# Patient Record
Sex: Male | Born: 1985 | Race: Black or African American | Hispanic: No | Marital: Single | Smoking: Never smoker
Health system: Southern US, Community
[De-identification: ages and names within clinical notes are randomized; demographics above are authoritative.]

## PROBLEM LIST (undated history)

## (undated) DIAGNOSIS — I639 Cerebral infarction, unspecified: Secondary | ICD-10-CM

## (undated) HISTORY — PX: ROTATOR CUFF REPAIR: SHX139

## (undated) HISTORY — DX: Cerebral infarction, unspecified: I63.9

## (undated) HISTORY — PX: HERNIA REPAIR: SHX51

---
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
## 2009-03-12 ENCOUNTER — Emergency Department (HOSPITAL_COMMUNITY): Admission: EM | Admit: 2009-03-12 | Discharge: 2009-03-12 | Payer: Self-pay | Admitting: Family Medicine

## 2010-05-26 ENCOUNTER — Emergency Department (HOSPITAL_COMMUNITY)
Admission: EM | Admit: 2010-05-26 | Discharge: 2010-05-26 | Disposition: A | Discharge status: Home / Self Care | Payer: BC Managed Care – PPO | Attending: Emergency Medicine | Admitting: Emergency Medicine

## 2010-05-26 DIAGNOSIS — K047 Periapical abscess without sinus: Secondary | ICD-10-CM | POA: Insufficient documentation

## 2010-05-26 DIAGNOSIS — R220 Localized swelling, mass and lump, head: Secondary | ICD-10-CM | POA: Insufficient documentation

## 2010-05-26 DIAGNOSIS — R221 Localized swelling, mass and lump, neck: Secondary | ICD-10-CM | POA: Insufficient documentation

## 2010-05-26 DIAGNOSIS — K089 Disorder of teeth and supporting structures, unspecified: Secondary | ICD-10-CM | POA: Insufficient documentation

## 2010-08-26 NOTE — Op Note (Signed)
NAME:  Marcus Hughes, Marcus Hughes                         ACCOUNT NO.:  [id number]   MEDICAL RECORD NO.:  [id number]                   PATIENT TYPE:  AMB   LOCATION:  DAY                                  FACILITY:  [facility]   PHYSICIAN:  Adolph Pollack, M.D.            DATE OF BIRTH:  04/03/1986   DATE OF PROCEDURE:  10/16/2003  DATE OF DISCHARGE:                                 OPERATIVE REPORT   PREOPERATIVE DIAGNOSIS:  Right inguinal hernia.   POSTOPERATIVE DIAGNOSIS:  Indirect right inguinal hernia.   PROCEDURE:  Repair of indirect right inguinal hernia with mesh.   SURGEON:  Adolph Pollack, M.D.   ANESTHESIA:  General.   INDICATIONS:  An 25 year old male who has noticed a right inguinal bulge for  a long time and then saw Dr. Lance Bosch and was diagnosed with a right inguinal  hernia.  He now presents for right inguinal hernia with mesh.  The procedure  and risks were explained during the preoperative period.   TECHNIQUE:  He is seen in the holding area and the right groin marked with  my initials.  He was brought to the operating room and placed supine on the  operating room table.  General anesthetic was administered.  The right groin  area was sterilely prepped and draped.  Local anesthetic was infiltrated in  the superficial and deep tissues and a right groin incision was made through  the skin, subcutaneous tissue, and Scarpa fascia until the external oblique  aponeurosis was identified.  Local anesthetic was identified deep to the  external oblique aponeurosis and an incision was made in it.  It was split  in the direction of its fibers to the external ring medially and up towards  the anterior superior iliac spine laterally.  Using blunt dissection, the  shelving edge of the inguinal ligament was exposed inferiorly, and the  internal oblique muscle and aponeurosis were exposed superiorly.  Both the  iliohypogastric and ilioinguinal nerves were exposed and retracted free from  the area.   The spermatic cord was then isolated with blunt dissection, and the indirect  sac was identified, which was fairly adherent to the cord structures.  Using  careful blunt dissection as well as selective sharp dissection and cautery,  I was able to dissect the sac free from the cord structures and reduce it  back into the extraperitoneal space through a patulous internal ring.  Placed the patient in the lower Trendelenburg position helped to keep the  sac in the extraperitoneal space.   Next, a 3 x 6 inch piece of polypropylene mesh was brought into the field  and anchored 1 cm medial to the pubic tubercle with a 2-0 Prolene suture.  The inferior aspect of the mesh was then anchored to the shelving edge of  the inguinal ligament with a running 2-0 Prolene up to the level 1 cm  lateral to the internal ring.  A slit was cut in the mesh and wrapped around  the cord.  The superior aspect of the mesh was then anchored to the internal  oblique muscle and aponeurosis with interrupted 2-0 Vicryl sutures.  The two  tails of the mesh were then wrapped around the cord, creating a new internal  ring.  These were then anchored to the shelving edge of the inguinal  ligament with a single 2-0 Prolene suture.  The tip of the hemostat was able  to be placed in the aperture, and the cord was mobile.   The lateral aspects of the mesh were then tucked deep to the external  oblique aponeurosis laterally.  Hemostasis was adequate at this time.  The  external oblique aponeurosis was closed with a mesh with a running 3-0  Vicryl suture.  Scarpa's fascia was closed with a running 2-0 Vicryl suture.  The skin was closed with a running 4-0 Monocryl subcuticular stitch.  Steri-  Strips and sterile dressings were applied.  The right testicle was in its  normal position in the scrotum.   He tolerated the procedure well without any apparent complications.  He was  taken to the recovery room in satisfactory  condition.                                               Adolph Pollack, M.D.    Kari Baars  D:  10/16/2003  T:  10/16/2003  Job:  [id number]   cc:   Reinaldo Raddle. Lance Bosch, M.D.  [facility]  [address]  [place]  [place] [address]  Fax: [phone]

## 2011-01-22 ENCOUNTER — Inpatient Hospital Stay (INDEPENDENT_AMBULATORY_CARE_PROVIDER_SITE_OTHER)
Admission: RE | Admit: 2011-01-22 | Discharge: 2011-01-22 | Disposition: A | Discharge status: Home / Self Care | Source: Ambulatory Visit | Attending: Family Medicine | Admitting: Family Medicine

## 2011-01-22 DIAGNOSIS — L259 Unspecified contact dermatitis, unspecified cause: Secondary | ICD-10-CM

## 2011-01-23 LAB — GC/CHLAMYDIA PROBE AMP, URINE: Chlamydia, Swab/Urine, PCR: NEGATIVE

## 2013-02-23 ENCOUNTER — Emergency Department (HOSPITAL_COMMUNITY): Payer: BC Managed Care – PPO

## 2013-02-23 ENCOUNTER — Encounter (HOSPITAL_COMMUNITY)
Admission: EM | Disposition: A | Discharge status: Home / Self Care | Payer: Self-pay | Source: Home / Self Care | Attending: Neurology

## 2013-02-23 ENCOUNTER — Inpatient Hospital Stay (HOSPITAL_COMMUNITY): Payer: BC Managed Care – PPO

## 2013-02-23 ENCOUNTER — Inpatient Hospital Stay (HOSPITAL_COMMUNITY)
Admission: EM | Admit: 2013-02-23 | Discharge: 2013-02-26 | DRG: 023 | Disposition: A | Discharge status: Home / Self Care | Payer: BC Managed Care – PPO | Attending: Neurology | Admitting: Neurology

## 2013-02-23 ENCOUNTER — Inpatient Hospital Stay (HOSPITAL_COMMUNITY): Payer: BC Managed Care – PPO | Admitting: Anesthesiology

## 2013-02-23 ENCOUNTER — Encounter (HOSPITAL_COMMUNITY): Payer: Self-pay | Admitting: Emergency Medicine

## 2013-02-23 ENCOUNTER — Encounter (HOSPITAL_COMMUNITY): Payer: BC Managed Care – PPO | Admitting: Anesthesiology

## 2013-02-23 DIAGNOSIS — H53149 Visual discomfort, unspecified: Secondary | ICD-10-CM

## 2013-02-23 DIAGNOSIS — F411 Generalized anxiety disorder: Secondary | ICD-10-CM | POA: Diagnosis not present

## 2013-02-23 DIAGNOSIS — I6350 Cerebral infarction due to unspecified occlusion or stenosis of unspecified cerebral artery: Secondary | ICD-10-CM

## 2013-02-23 DIAGNOSIS — R519 Headache, unspecified: Secondary | ICD-10-CM | POA: Diagnosis present

## 2013-02-23 DIAGNOSIS — R29810 Facial weakness: Secondary | ICD-10-CM | POA: Diagnosis present

## 2013-02-23 DIAGNOSIS — H53469 Homonymous bilateral field defects, unspecified side: Secondary | ICD-10-CM | POA: Diagnosis present

## 2013-02-23 DIAGNOSIS — F1210 Cannabis abuse, uncomplicated: Secondary | ICD-10-CM | POA: Diagnosis present

## 2013-02-23 DIAGNOSIS — R61 Generalized hyperhidrosis: Secondary | ICD-10-CM | POA: Diagnosis present

## 2013-02-23 DIAGNOSIS — G936 Cerebral edema: Secondary | ICD-10-CM | POA: Diagnosis present

## 2013-02-23 DIAGNOSIS — I639 Cerebral infarction, unspecified: Secondary | ICD-10-CM | POA: Diagnosis present

## 2013-02-23 DIAGNOSIS — E876 Hypokalemia: Secondary | ICD-10-CM | POA: Diagnosis present

## 2013-02-23 DIAGNOSIS — E785 Hyperlipidemia, unspecified: Secondary | ICD-10-CM

## 2013-02-23 DIAGNOSIS — I6340 Cerebral infarction due to embolism of unspecified cerebral artery: Principal | ICD-10-CM | POA: Diagnosis present

## 2013-02-23 DIAGNOSIS — R4789 Other speech disturbances: Secondary | ICD-10-CM | POA: Diagnosis present

## 2013-02-23 DIAGNOSIS — I63511 Cerebral infarction due to unspecified occlusion or stenosis of right middle cerebral artery: Secondary | ICD-10-CM

## 2013-02-23 DIAGNOSIS — R51 Headache: Secondary | ICD-10-CM | POA: Diagnosis present

## 2013-02-23 DIAGNOSIS — Z79899 Other long term (current) drug therapy: Secondary | ICD-10-CM

## 2013-02-23 DIAGNOSIS — R1310 Dysphagia, unspecified: Secondary | ICD-10-CM | POA: Diagnosis present

## 2013-02-23 DIAGNOSIS — G8190 Hemiplegia, unspecified affecting unspecified side: Secondary | ICD-10-CM | POA: Diagnosis present

## 2013-02-23 DIAGNOSIS — J9600 Acute respiratory failure, unspecified whether with hypoxia or hypercapnia: Secondary | ICD-10-CM | POA: Diagnosis not present

## 2013-02-23 HISTORY — PX: RADIOLOGY WITH ANESTHESIA: SHX6223

## 2013-02-23 LAB — BLOOD GAS, ARTERIAL
Acid-base deficit: 5.2 mmol/L — ABNORMAL HIGH (ref 0.0–2.0)
Bicarbonate: 18.8 mEq/L — ABNORMAL LOW (ref 20.0–24.0)
FIO2: 0.4 %
O2 Saturation: 99 %
PEEP: 5 cmH2O
Patient temperature: 94.3
RATE: 18 resp/min

## 2013-02-23 LAB — PROTIME-INR
INR: 0.98 (ref 0.00–1.49)
Prothrombin Time: 12.8 seconds (ref 11.6–15.2)

## 2013-02-23 LAB — RAPID URINE DRUG SCREEN, HOSP PERFORMED
Amphetamines: NOT DETECTED
Tetrahydrocannabinol: POSITIVE — AB

## 2013-02-23 LAB — COMPREHENSIVE METABOLIC PANEL
AST: 47 U/L — ABNORMAL HIGH (ref 0–37)
BUN: 17 mg/dL (ref 6–23)
CO2: 24 mEq/L (ref 19–32)
Chloride: 101 mEq/L (ref 96–112)
Creatinine, Ser: 1.29 mg/dL (ref 0.50–1.35)
GFR calc non Af Amer: 75 mL/min — ABNORMAL LOW (ref >90)
Total Bilirubin: 0.6 mg/dL (ref 0.3–1.2)

## 2013-02-23 LAB — URINALYSIS, ROUTINE W REFLEX MICROSCOPIC
Glucose, UA: NEGATIVE mg/dL
Ketones, ur: NEGATIVE mg/dL
Leukocytes, UA: NEGATIVE
Protein, ur: NEGATIVE mg/dL

## 2013-02-23 LAB — ACETAMINOPHEN LEVEL: Acetaminophen (Tylenol), Serum: <15 ug/mL (ref 10–30)

## 2013-02-23 LAB — CBC WITH DIFFERENTIAL/PLATELET
HCT: 39.9 % (ref 39.0–52.0)
Hemoglobin: 14.3 g/dL (ref 13.0–17.0)
Lymphocytes Relative: 42 % (ref 12–46)
Lymphs Abs: 2.1 10*3/uL (ref 0.7–4.0)
Monocytes Absolute: 0.4 10*3/uL (ref 0.1–1.0)
Monocytes Relative: 8 % (ref 3–12)
Neutro Abs: 2.4 10*3/uL (ref 1.7–7.7)
WBC: 5 10*3/uL (ref 4.0–10.5)

## 2013-02-23 LAB — ETHANOL: Alcohol, Ethyl (B): <11 mg/dL (ref 0–11)

## 2013-02-23 SURGERY — RADIOLOGY WITH ANESTHESIA
Anesthesia: General

## 2013-02-23 MED ORDER — ALTEPLASE 30 MG/30 ML FOR INTERV. RAD
1.0000 mg | INTRA_ARTERIAL | Status: AC
  Administered 2013-02-23: 9 mg via INTRA_ARTERIAL
  Filled 2013-02-23: qty 30

## 2013-02-23 MED ORDER — FENTANYL CITRATE 0.05 MG/ML IJ SOLN
INTRAMUSCULAR | Status: AC | PRN
  Administered 2013-02-23: 25 ug via INTRAVENOUS

## 2013-02-23 MED ORDER — NICARDIPINE HCL IN NACL 20-0.86 MG/200ML-% IV SOLN: 5.0000 mg/h | INTRAVENOUS | Status: DC

## 2013-02-23 MED ORDER — SODIUM CHLORIDE 0.9 % IV SOLN
INTRAVENOUS | Status: DC
  Administered 2013-02-24: 21:00:00 via INTRAVENOUS
  Administered 2013-02-24: 1000 mL via INTRAVENOUS

## 2013-02-23 MED ORDER — FENTANYL CITRATE 0.05 MG/ML IJ SOLN
25.0000 ug | INTRAMUSCULAR | Status: DC | PRN
  Administered 2013-02-24 (×2): 100 ug via INTRAVENOUS
  Filled 2013-02-23 (×2): qty 2

## 2013-02-23 MED ORDER — PANTOPRAZOLE SODIUM 40 MG IV SOLR
40.0000 mg | Freq: Every day | INTRAVENOUS | Status: DC
  Administered 2013-02-24 (×2): 40 mg via INTRAVENOUS
  Filled 2013-02-23 (×3): qty 40

## 2013-02-23 MED ORDER — MIDAZOLAM HCL 2 MG/2ML IJ SOLN
INTRAMUSCULAR | Status: AC
  Filled 2013-02-23: qty 2

## 2013-02-23 MED ORDER — ONDANSETRON HCL 4 MG/2ML IJ SOLN
4.0000 mg | Freq: Four times a day (QID) | INTRAMUSCULAR | Status: DC | PRN
  Administered 2013-02-24: 4 mg via INTRAVENOUS
  Filled 2013-02-23: qty 2

## 2013-02-23 MED ORDER — VECURONIUM BROMIDE 10 MG IV SOLR
INTRAVENOUS | Status: DC | PRN
  Administered 2013-02-23: 3 mg via INTRAVENOUS
  Administered 2013-02-23: 2 mg via INTRAVENOUS
  Administered 2013-02-23: 5 mg via INTRAVENOUS

## 2013-02-23 MED ORDER — LABETALOL HCL 5 MG/ML IV SOLN: 10.0000 mg | INTRAVENOUS | Status: DC | PRN

## 2013-02-23 MED ORDER — SODIUM CHLORIDE 0.9 % IV BOLUS (SEPSIS)
1000.0000 mL | Freq: Once | INTRAVENOUS | Status: AC
  Administered 2013-02-23: 1000 mL via INTRAVENOUS

## 2013-02-23 MED ORDER — ARTIFICIAL TEARS OP OINT
TOPICAL_OINTMENT | OPHTHALMIC | Status: DC | PRN
  Administered 2013-02-23: 1 via OPHTHALMIC

## 2013-02-23 MED ORDER — MIDAZOLAM HCL 2 MG/2ML IJ SOLN
INTRAMUSCULAR | Status: AC | PRN
  Administered 2013-02-23: 1 mg via INTRAVENOUS

## 2013-02-23 MED ORDER — ACETAMINOPHEN 650 MG RE SUPP: 650.0000 mg | Freq: Four times a day (QID) | RECTAL | Status: DC | PRN

## 2013-02-23 MED ORDER — PHENYLEPHRINE HCL 10 MG/ML IJ SOLN
INTRAMUSCULAR | Status: DC | PRN
  Administered 2013-02-23: 40 ug via INTRAVENOUS

## 2013-02-23 MED ORDER — FENTANYL CITRATE 0.05 MG/ML IJ SOLN
INTRAMUSCULAR | Status: DC | PRN
  Administered 2013-02-23 (×5): 50 ug via INTRAVENOUS

## 2013-02-23 MED ORDER — SUCCINYLCHOLINE CHLORIDE 20 MG/ML IJ SOLN
INTRAMUSCULAR | Status: DC | PRN
  Administered 2013-02-23: 120 mg via INTRAVENOUS

## 2013-02-23 MED ORDER — HYDROMORPHONE HCL PF 1 MG/ML IJ SOLN
1.0000 mg | Freq: Once | INTRAMUSCULAR | Status: AC
  Administered 2013-02-23: 1 mg via INTRAVENOUS
  Filled 2013-02-23: qty 1

## 2013-02-23 MED ORDER — PROPOFOL INFUSION 10 MG/ML OPTIME
INTRAVENOUS | Status: DC | PRN
  Administered 2013-02-23: 100 ug/kg/min via INTRAVENOUS
  Administered 2013-02-23: 22:00:00 via INTRAVENOUS

## 2013-02-23 MED ORDER — ACETAMINOPHEN 500 MG PO TABS
1000.0000 mg | ORAL_TABLET | Freq: Four times a day (QID) | ORAL | Status: DC | PRN
  Administered 2013-02-24: 1000 mg via ORAL
  Filled 2013-02-23: qty 2

## 2013-02-23 MED ORDER — SODIUM CHLORIDE 0.9 % IV SOLN
INTRAVENOUS | Status: DC | PRN
  Administered 2013-02-23 (×2): via INTRAVENOUS

## 2013-02-23 MED ORDER — ALTEPLASE (STROKE) FULL DOSE INFUSION
0.9000 mg/kg | Freq: Once | INTRAVENOUS | Status: AC
  Administered 2013-02-23: 70 mg via INTRAVENOUS
  Filled 2013-02-23: qty 78

## 2013-02-23 MED ORDER — ACETAMINOPHEN 325 MG PO TABS: 650.0000 mg | ORAL_TABLET | ORAL | Status: DC | PRN

## 2013-02-23 MED ORDER — NITROGLYCERIN 1 MG/10 ML FOR IR/CATH LAB
INTRA_ARTERIAL | Status: AC
  Filled 2013-02-23: qty 10

## 2013-02-23 MED ORDER — LIDOCAINE HCL (CARDIAC) 20 MG/ML IV SOLN
INTRAVENOUS | Status: DC | PRN
  Administered 2013-02-23: 100 mg via INTRAVENOUS

## 2013-02-23 MED ORDER — IOHEXOL 300 MG/ML  SOLN
150.0000 mL | Freq: Once | INTRAMUSCULAR | Status: AC | PRN
  Administered 2013-02-23: 150 mL via INTRA_ARTERIAL

## 2013-02-23 MED ORDER — ACETAMINOPHEN 650 MG RE SUPP: 650.0000 mg | RECTAL | Status: DC | PRN

## 2013-02-23 MED ORDER — SODIUM CHLORIDE 0.9 % IV SOLN
INTRAVENOUS | Status: DC
  Administered 2013-02-24: 1000 mL via INTRAVENOUS

## 2013-02-23 MED ORDER — FENTANYL CITRATE 0.05 MG/ML IJ SOLN
INTRAMUSCULAR | Status: AC
  Filled 2013-02-23: qty 2

## 2013-02-23 MED ORDER — PROPOFOL 10 MG/ML IV BOLUS
INTRAVENOUS | Status: DC | PRN
  Administered 2013-02-23: 300 mg via INTRAVENOUS
  Administered 2013-02-23: 20 mg via INTRAVENOUS

## 2013-02-23 MED ORDER — SODIUM CHLORIDE 0.9 % IJ SOLN
INTRAMUSCULAR | Status: AC
  Filled 2013-02-23: qty 300

## 2013-02-23 MED ORDER — SODIUM CHLORIDE 0.9 % IV SOLN: 250.0000 mL | Freq: Once | INTRAVENOUS | Status: DC

## 2013-02-23 MED ORDER — PROPOFOL 10 MG/ML IV EMUL
0.0000 ug/kg/min | INTRAVENOUS | Status: DC
  Administered 2013-02-23 – 2013-02-24 (×2): 50 ug/kg/min via INTRAVENOUS
  Administered 2013-02-24: 85 ug/kg/min via INTRAVENOUS
  Administered 2013-02-24: 50 ug/kg/min via INTRAVENOUS
  Administered 2013-02-24: 100 ug/kg/min via INTRAVENOUS
  Filled 2013-02-23 (×6): qty 100

## 2013-02-23 MED ORDER — IOHEXOL 350 MG/ML SOLN
100.0000 mL | Freq: Once | INTRAVENOUS | Status: AC | PRN
  Administered 2013-02-23: 100 mL via INTRAVENOUS

## 2013-02-23 MED ORDER — PHENYLEPHRINE HCL 10 MG/ML IJ SOLN
10.0000 mg | INTRAVENOUS | Status: DC | PRN
  Administered 2013-02-23: 10 ug/min via INTRAVENOUS

## 2013-02-23 MED ORDER — ALTEPLASE 100 MG IV SOLR
INTRAVENOUS | Status: AC
  Administered 2013-02-23: 70 mg via INTRAVENOUS
  Filled 2013-02-23: qty 1

## 2013-02-23 NOTE — Anesthesia Procedure Notes (Addendum)
Procedure Name: Intubation Date/Time: 02/23/2013 8:11 PM Performed by: Luster Landsberg Pre-anesthesia Checklist: Patient identified, Emergency Drugs available, Suction available and Patient being monitored Patient Re-evaluated:Patient Re-evaluated prior to inductionOxygen Delivery Method: Circle system utilized Preoxygenation: Pre-oxygenation with 100% oxygen Intubation Type: IV induction, Rapid sequence and Cricoid Pressure applied Laryngoscope Size: Mac and 3 Grade View: Grade I Tube type: Subglottic suction tube Tube size: 7.5 mm Number of attempts: 1 Airway Equipment and Method: Stylet Placement Confirmation: ETT inserted through vocal cords under direct vision,  positive ETCO2 and breath sounds checked- equal and bilateral Secured at: 24 cm Tube secured with: Tape Dental Injury: Teeth and Oropharynx as per pre-operative assessment

## 2013-02-23 NOTE — H&P (Signed)
Admission H&P    Chief Complaint: New-onset slurred speech and right facial droop with hemiparesis.  HPI: Marcus Hughes is an 27 y.o. male with no known previous medical history who experienced sudden onset of weakness involving his left arm and left leg about 3:00 today. Patient fell to the ground and describes the fall as tripping over his leg. He noticed difficulty with use of his left extremities prior to falling. He hit his head when he fell but doesn't think he lost consciousness. CT scan of his head showed no acute intracranial abnormality. There was and equivocal increase in density of his right MCA, however. He was deemed a candidate for TPA which was administered prior to obtaining a CT angiogram of the head and neck, and subsequent transfer to [facility] for further management. 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 NIH stroke score on arrival here was 12. Formal catheter angiography was planned to be done on urgent basis to rule out possible dissection.  LSN: [id number] on 02/23/2013 tPA Given: Yes MRankin: 2  History reviewed. No pertinent past medical history.  History reviewed. No pertinent past surgical history.  No family history on file. Social History:  reports that he has never smoked. He does not have any smokeless tobacco history on file. He reports that he uses illicit drugs (Cocaine and Marijuana). He reports that he does not drink alcohol.  Allergies: No Known Allergies   (Not in a hospital admission)  ROS: History obtained from the patient  General ROS: negative for - chills, fatigue, fever, night sweats, weight gain or weight loss Psychological ROS: negative for - behavioral disorder, hallucinations, memory difficulties, mood swings or suicidal ideation Ophthalmic ROS: negative for - blurry vision, double vision, eye pain or loss of vision ENT ROS: negative for - epistaxis, nasal discharge, oral lesions, sore throat, tinnitus or vertigo Allergy  and Immunology ROS: negative for - hives or itchy/watery eyes Hematological and Lymphatic ROS: negative for - bleeding problems, bruising or swollen lymph nodes Endocrine ROS: negative for - galactorrhea, hair pattern changes, polydipsia/polyuria or temperature intolerance Respiratory ROS: negative for - cough, hemoptysis, shortness of breath or wheezing Cardiovascular ROS: negative for - chest pain, dyspnea on exertion, edema or irregular heartbeat Gastrointestinal ROS: negative for - abdominal pain, diarrhea, hematemesis, nausea/vomiting or stool incontinence Genito-Urinary ROS: negative for - dysuria, hematuria, incontinence or urinary frequency/urgency Musculoskeletal ROS: negative for - joint swelling or muscular weakness Neurological ROS: as noted in HPI Dermatological ROS: negative for rash and skin lesion changes  Physical Examination: Blood pressure 133/74, pulse 63, resp. rate 14, height 5\' 11"  (1.803 m), weight 86.183 kg (190 lb), SpO2 99.00%.  HEENT-  Normocephalic, no lesions, without obvious abnormality.  Normal external eye and conjunctiva.  Normal TM's bilaterally.  Normal auditory canals and external ears. Normal external nose, mucus membranes and septum.  Normal pharynx. Neck supple with no masses, nodes, nodules or enlargement. Cardiovascular - regular rate and rhythm, S1, S2 normal, no murmur, click, rub or gallop Lungs - chest clear, no wheezing, rales, normal symmetric air entry, Heart exam - S1, S2 normal, no murmur, no gallop, rate regular Abdomen - soft, non-tender; bowel sounds normal; no masses,  no organomegaly Extremities - no joint deformities, effusion, or inflammation, no edema and no skin discoloration  Neurologic Examination: Mental Status: Alert, oriented, thought content appropriate.  Speech fluent without evidence of aphasia. Able to follow commands without difficulty. Cranial Nerves: II-incomplete left homonymous hemianopsia. III/IV/VI-Pupils were  equal  and reacted. Extraocular movements were full and conjugate.    V/VII-mild left facial numbness and facial weakness. VIII-normal. X-normal speech and symmetrical palatal movement. Motor: Severe weakness of left upper extremity proximally and distally; moderate weakness of left lower extremity proximally and distally; normal strength of right extremities; flaccid muscle tone throughout. Sensory: Dense sensory loss to tactile stimulation over left arm and left leg. Deep Tendon Reflexes: 2+ and symmetric. Plantars: Mute on the left and flexor on the right. Cerebellar: Normal finger-to-nose testing on the right; unable to perform with left upper extremity.  Results for orders placed during the hospital encounter of 02/23/13 (from the past 48 hour(s))  CBC WITH DIFFERENTIAL     Status: None   Collection Time    02/23/13  4:02 PM      Result Value Range   WBC 5.0  4.0 - 10.5 K/uL   RBC 4.59  4.22 - 5.81 MIL/uL   Hemoglobin 14.3  13.0 - 17.0 g/dL   HCT 16.1  09.6 - 04.5 %   MCV 86.9  78.0 - 100.0 fL   MCH 31.2  26.0 - 34.0 pg   MCHC 35.8  30.0 - 36.0 g/dL   RDW 40.9  81.1 - 91.4 %   Platelets 212  150 - 400 K/uL   Neutrophils Relative % 47  43 - 77 %   Neutro Abs 2.4  1.7 - 7.7 K/uL   Lymphocytes Relative 42  12 - 46 %   Lymphs Abs 2.1  0.7 - 4.0 K/uL   Monocytes Relative 8  3 - 12 %   Monocytes Absolute 0.4  0.1 - 1.0 K/uL   Eosinophils Relative 3  0 - 5 %   Eosinophils Absolute 0.2  0.0 - 0.7 K/uL   Basophils Relative 0  0 - 1 %   Basophils Absolute 0.0  0.0 - 0.1 K/uL  COMPREHENSIVE METABOLIC PANEL     Status: Abnormal   Collection Time    02/23/13  4:02 PM      Result Value Range   Sodium 137  135 - 145 mEq/L   Potassium 3.2 (*) 3.5 - 5.1 mEq/L   Chloride 101  96 - 112 mEq/L   CO2 24  19 - 32 mEq/L   Glucose, Bld 125 (*) 70 - 99 mg/dL   BUN 17  6 - 23 mg/dL   Creatinine, Ser 7.82  0.50 - 1.35 mg/dL   Calcium 9.7  8.4 - 95.6 mg/dL   Total Protein 7.8  6.0 - 8.3 g/dL    Albumin 4.6  3.5 - 5.2 g/dL   AST 47 (*) 0 - 37 U/L   ALT 31  0 - 53 U/L   Alkaline Phosphatase 41  39 - 117 U/L   Total Bilirubin 0.6  0.3 - 1.2 mg/dL   GFR calc non Af Amer 75 (*) >90 mL/min   GFR calc Af Amer 87 (*) >90 mL/min   Comment: (NOTE)     The eGFR has been calculated using the CKD EPI equation.     This calculation has not been validated in all clinical situations.     eGFR's persistently <90 mL/min signify possible Chronic Kidney     Disease.  TROPONIN I     Status: None   Collection Time    02/23/13  4:02 PM      Result Value Range   Troponin I <0.30  <0.30 ng/mL   Comment:  Due to the release kinetics of cTnI,     a negative result within the first hours     of the onset of symptoms does not rule out     myocardial infarction with certainty.     If myocardial infarction is still suspected,     repeat the test at appropriate intervals.  ETHANOL     Status: None   Collection Time    02/23/13  4:02 PM      Result Value Range   Alcohol, Ethyl (B) <11  0 - 11 mg/dL   Comment:            LOWEST DETECTABLE LIMIT FOR     SERUM ALCOHOL IS 11 mg/dL     FOR MEDICAL PURPOSES ONLY  ACETAMINOPHEN LEVEL     Status: None   Collection Time    02/23/13  4:02 PM      Result Value Range   Acetaminophen (Tylenol), Serum <15.0  10 - 30 ug/mL   Comment:            THERAPEUTIC CONCENTRATIONS VARY     SIGNIFICANTLY. A RANGE OF 10-30     ug/mL MAY BE AN EFFECTIVE     CONCENTRATION FOR MANY PATIENTS.     HOWEVER, SOME ARE BEST TREATED     AT CONCENTRATIONS OUTSIDE THIS     RANGE.     ACETAMINOPHEN CONCENTRATIONS     >150 ug/mL AT 4 HOURS AFTER     INGESTION AND >50 ug/mL AT 12     HOURS AFTER INGESTION ARE     OFTEN ASSOCIATED WITH TOXIC     REACTIONS.  SALICYLATE LEVEL     Status: Abnormal   Collection Time    02/23/13  4:02 PM      Result Value Range   Salicylate Lvl <2.0 (*) 2.8 - 20.0 mg/dL  LACTIC ACID, PLASMA     Status: None   Collection Time     02/23/13  4:04 PM      Result Value Range   Lactic Acid, Venous 1.5  0.5 - 2.2 mmol/L  PROTIME-INR     Status: None   Collection Time    02/23/13  4:20 PM      Result Value Range   Prothrombin Time 12.8  11.6 - 15.2 seconds   INR 0.98  0.00 - 1.49  APTT     Status: None   Collection Time    02/23/13  4:20 PM      Result Value Range   aPTT 30  24 - 37 seconds  URINALYSIS, ROUTINE W REFLEX MICROSCOPIC     Status: Abnormal   Collection Time    02/23/13  5:03 PM      Result Value Range   Color, Urine YELLOW  YELLOW   APPearance HAZY (*) CLEAR   Specific Gravity, Urine 1.025  1.005 - 1.030   pH 6.0  5.0 - 8.0   Glucose, UA NEGATIVE  NEGATIVE mg/dL   Hgb urine dipstick NEGATIVE  NEGATIVE   Bilirubin Urine NEGATIVE  NEGATIVE   Ketones, ur NEGATIVE  NEGATIVE mg/dL   Protein, ur NEGATIVE  NEGATIVE mg/dL   Urobilinogen, UA 0.2  0.0 - 1.0 mg/dL   Nitrite NEGATIVE  NEGATIVE   Leukocytes, UA NEGATIVE  NEGATIVE   Comment: MICROSCOPIC NOT DONE ON URINES WITH NEGATIVE PROTEIN, BLOOD, LEUKOCYTES, NITRITE, OR GLUCOSE <1000 mg/dL.  URINE RAPID DRUG SCREEN (HOSP PERFORMED)     Status: Abnormal  Collection Time    02/23/13  5:03 PM      Result Value Range   Opiates NONE DETECTED  NONE DETECTED   Cocaine NONE DETECTED  NONE DETECTED   Benzodiazepines NONE DETECTED  NONE DETECTED   Amphetamines NONE DETECTED  NONE DETECTED   Tetrahydrocannabinol POSITIVE (*) NONE DETECTED   Barbiturates NONE DETECTED  NONE DETECTED   Comment:            DRUG SCREEN FOR MEDICAL PURPOSES     ONLY.  IF CONFIRMATION IS NEEDED     FOR ANY PURPOSE, NOTIFY LAB     WITHIN 5 DAYS.                LOWEST DETECTABLE LIMITS     FOR URINE DRUG SCREEN     Drug Class       Cutoff (ng/mL)     Amphetamine      1000     Barbiturate      200     Benzodiazepine   200     Tricyclics       300     Opiates          300     Cocaine          300     THC              50   Ct Angio Head W/cm &/or Wo Cm  02/23/2013    CLINICAL DATA:  Left-sided weakness.  Fall.  Abnormal head CT.  EXAM: CT ANGIOGRAPHY HEAD AND NECK  TECHNIQUE: Multidetector CT imaging of the head and neck was performed using the standard protocol during bolus administration of intravenous contrast. Multiplanar CT image reconstructions including MIPs were obtained to evaluate the vascular anatomy. Carotid stenosis measurements (when applicable) are obtained utilizing NASCET criteria, using the distal internal carotid diameter as the denominator.  CONTRAST:  OMNIPAQUE IOHEXOL 350 MG/ML SOLN  COMPARISON:  Head CT same day  FINDINGS: CTA HEAD FINDINGS  Both internal carotid arteries are patent through the skullbase. Both carotid siphon regions appear normal. On the left, the anterior and middle cerebral vessels are normal without proximal stenosis, aneurysm or vascular malformation. On the right, there is occlusion of the middle cerebral artery at its origin with reconstitution after 1 cm.  Both vertebral arteries are patent to the basilar. No basilar stenosis. Posterior circulation branch vessels are patent.  There is mild swelling of the right hemisphere.  No hemorrhage.  Review of the MIP images confirms the above findings.  CTA NECK FINDINGS  Lung apices are clear. Branching pattern of the brachiocephalic vessels from the arch is normal. No origin stenoses. Both common carotid arteries are widely patent to their respective bifurcation. Both carotid bifurcations are normal. Both cervical internal carotid arteries are normal.  Both vertebral arteries are patent and normal. The right vertebral artery is small. Left vertebral artery is dominant. No vertebral disease.  Review of the MIP images confirms the above findings.  IMPRESSION: Normal CT angiography of the neck vessels.  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 Reconstitution 1 cm distal to that.  Mild swelling of the right hemisphere.  No hemorrhage.   Electronically Signed   By: Paulina Fusi M.D.   On: 02/23/2013 18:43   Dg Chest 1 View  02/23/2013   CLINICAL DATA:  Head trauma secondary to a fall today.  EXAM: CHEST - 1 VIEW  COMPARISON:  None.  FINDINGS: The heart size and mediastinal contours are within normal limits. Both lungs are clear. The visualized skeletal structures are unremarkable.  IMPRESSION: Normal exam.   Electronically Signed   By: Geanie Cooley M.D.   On: 02/23/2013 15:58   Ct Head Wo Contrast  02/23/2013   CLINICAL DATA:  Post fall, now with altered level of consciousness and left-sided weakness  EXAM: CT HEAD WITHOUT CONTRAST  CT CERVICAL SPINE WITHOUT CONTRAST  TECHNIQUE: Multidetector CT imaging of the head and cervical spine was performed following the standard protocol without intravenous contrast. Multiplanar CT image reconstructions of the cervical spine were also generated.  COMPARISON:  None.  FINDINGS: CT HEAD FINDINGS  The examination is minimally degraded due to patient's head being held in a minimal degree of obliquity.  There is apparent asymmetric increased attenuation of the right MCA. This finding is without associated discrete blurring of the right-side gray-white differentiation although there does appear to be minimal (approximately 3 mm) of right to left midline shift (image 14, series 2) which may suggest mild diffuse right-sided cerebral edema.  No intraparenchymal or extra-axial mass or hemorrhage. There is very minimal effacement of the right lateral ventricle with otherwise normal size and configuration of the ventricles and basilar cisterns.  Limited visualization of the paranasal sinuses demonstrates polypoid mucosal thickening involving the right posterior ethmoidal air cells. No air-fluid levels. Regional soft tissues are normal. No displaced calvarial fracture.  CT CERVICAL SPINE FINDINGS  C1 to the superior endplate of T1 is image.  Normal alignment of the cervical spine. No anterolisthesis or retrolisthesis. The bilateral facets are  normally aligned. The dens is normally positioned between the lateral mass of C1. Normal atlantodental and atlantoaxial articulations.  No fracture or static subluxation of the cervical spine. Cervical vertebral body heights are preserved. Prevertebral soft tissues are normal.  Intervertebral disc spaces are preserved.  Scattered shoddy bilateral cervical lymph nodes are individually not enlarged by size criteria. No bulky cervical lymphadenopathy on this noncontrast examination. Normal noncontrast appearance of the thyroid gland. Normal appearance of the visualized bilateral lung apices.  IMPRESSION: 1. Suspected right-sided hyperdense MCA sign with findings suggestive of mild diffuse right-sided cerebral edema with associated minimal (approximately 3 mm) of right to left midline shift. While there is relative preservation of the gray-white junction, these findings are worrisome for an early right MCA distribution infarct. Further evaluation with MRI may be performed as clinically indicated. 2. No fracture or static subluxation of the cervical spine.  Critical Value/emergent results were called by telephone at the time of interpretation on 02/23/2013 at 4:06 PM to Dr.STEPHEN RANCOUR , who verbally acknowledged these results.   Electronically Signed   By: Simonne Come M.D.   On: 02/23/2013 16:14   Ct Angio Neck W/cm &/or Wo/cm  02/23/2013   CLINICAL DATA:  Left-sided weakness.  Fall.  Abnormal head CT.  EXAM: CT ANGIOGRAPHY HEAD AND NECK  TECHNIQUE: Multidetector CT imaging of the head and neck was performed using the standard protocol during bolus administration of intravenous contrast. Multiplanar CT image reconstructions including MIPs were obtained to evaluate the vascular anatomy. Carotid stenosis measurements (when applicable) are obtained utilizing NASCET criteria, using the distal internal carotid diameter as the denominator.  CONTRAST:  OMNIPAQUE IOHEXOL 350 MG/ML SOLN  COMPARISON:  Head CT same  day  FINDINGS: CTA HEAD FINDINGS  Both internal carotid arteries are patent through the skullbase. Both carotid siphon regions appear normal. On the left, the anterior  and middle cerebral vessels are normal without proximal stenosis, aneurysm or vascular malformation. On the right, there is occlusion of the middle cerebral artery at its origin with reconstitution after 1 cm.  Both vertebral arteries are patent to the basilar. No basilar stenosis. Posterior circulation branch vessels are patent.  There is mild swelling of the right hemisphere.  No hemorrhage.  Review of the MIP images confirms the above findings.  CTA NECK FINDINGS  Lung apices are clear. Branching pattern of the brachiocephalic vessels from the arch is normal. No origin stenoses. Both common carotid arteries are widely patent to their respective bifurcation. Both carotid bifurcations are normal. Both cervical internal carotid arteries are normal.  Both vertebral arteries are patent and normal. The right vertebral artery is small. Left vertebral artery is dominant. No vertebral disease.  Review of the MIP images confirms the above findings.  IMPRESSION: Normal CT angiography of the neck vessels.  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 Reconstitution 1 cm distal to that.  Mild swelling of the right hemisphere.  No hemorrhage.   Electronically Signed   By: Paulina Fusi M.D.   On: 02/23/2013 18:43   Ct Chest W Contrast  02/23/2013   CLINICAL DATA:  Fall, altered level of consciousness  EXAM: CT CHEST WITH CONTRAST  TECHNIQUE: Multidetector CT imaging of the chest was performed during intravenous contrast administration.  CONTRAST:  OMNIPAQUE IOHEXOL 350 MG/ML SOLN  COMPARISON:  CT head and neck are dictated separately under a dedicated report  FINDINGS: Heart size at upper limits of normal. No pericardial or pleural effusion. Small AP window lymph node measuring approximately 4 mm incidentally noted. Symmetric bilateral  retroareolar soft tissue density may represent gynecomastia, which could be asymptomatic could streak artifact from the patient's arms overlies the thorax.  Motion artifact degrades imaging at multiple levels. Allowing for this, the lungs are clear. No pneumothorax. Central airways are patent. No acute osseous abnormality.  IMPRESSION: No acute intrathoracic abnormality.   Electronically Signed   By: Christiana Pellant M.D.   On: 02/23/2013 17:47   Ct Cervical Spine Wo Contrast  02/23/2013   CLINICAL DATA:  Post fall, now with altered level of consciousness and left-sided weakness  EXAM: CT HEAD WITHOUT CONTRAST  CT CERVICAL SPINE WITHOUT CONTRAST  TECHNIQUE: Multidetector CT imaging of the head and cervical spine was performed following the standard protocol without intravenous contrast. Multiplanar CT image reconstructions of the cervical spine were also generated.  COMPARISON:  None.  FINDINGS: CT HEAD FINDINGS  The examination is minimally degraded due to patient's head being held in a minimal degree of obliquity.  There is apparent asymmetric increased attenuation of the right MCA. This finding is without associated discrete blurring of the right-side gray-white differentiation although there does appear to be minimal (approximately 3 mm) of right to left midline shift (image 14, series 2) which may suggest mild diffuse right-sided cerebral edema.  No intraparenchymal or extra-axial mass or hemorrhage. There is very minimal effacement of the right lateral ventricle with otherwise normal size and configuration of the ventricles and basilar cisterns.  Limited visualization of the paranasal sinuses demonstrates polypoid mucosal thickening involving the right posterior ethmoidal air cells. No air-fluid levels. Regional soft tissues are normal. No displaced calvarial fracture.  CT CERVICAL SPINE FINDINGS  C1 to the superior endplate of T1 is image.  Normal alignment of the cervical spine. No anterolisthesis or  retrolisthesis. The bilateral facets are normally aligned. The dens  is normally positioned between the lateral mass of C1. Normal atlantodental and atlantoaxial articulations.  No fracture or static subluxation of the cervical spine. Cervical vertebral body heights are preserved. Prevertebral soft tissues are normal.  Intervertebral disc spaces are preserved.  Scattered shoddy bilateral cervical lymph nodes are individually not enlarged by size criteria. No bulky cervical lymphadenopathy on this noncontrast examination. Normal noncontrast appearance of the thyroid gland. Normal appearance of the visualized bilateral lung apices.  IMPRESSION: 1. Suspected right-sided hyperdense MCA sign with findings suggestive of mild diffuse right-sided cerebral edema with associated minimal (approximately 3 mm) of right to left midline shift. While there is relative preservation of the gray-white junction, these findings are worrisome for an early right MCA distribution infarct. Further evaluation with MRI may be performed as clinically indicated. 2. No fracture or static subluxation of the cervical spine.  Critical Value/emergent results were called by telephone at the time of interpretation on 02/23/2013 at 4:06 PM to Dr.STEPHEN RANCOUR , who verbally acknowledged these results.   Electronically Signed   By: Simonne Come M.D.   On: 02/23/2013 16:14    Assessment: 27 y.o. 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 Etiology is unclear. Carotid dissection we'll still need to be ruled out. As well, patient may intervention, including possible IA TPA , as well as possible thrombectomy.  Stroke Risk Factors - family history  Plan: 1. HgbA1c, fasting lipid panel 2. MRI of the brain without contrast 3. PT consult, OT consult, Speech consult 4. Echocardiogram 5. Prophylactic therapy-to be determined following angiogram and possible interventional  procedure(s) 6. Risk factor modification 7. Studies to rule out possible hypercoagulable state.   Patient's admission evaluation and management required complex diagnostic studies and interpretation as well as complex management decision-making including consultation with interventional radiology for catheter arteriogram and possible revascularization procedures. Total critical care time was 90 minutes.  I will AP and be 15 C.R. Roseanne Reno, MD 02/23/2013, 6:49 PM

## 2013-02-23 NOTE — ED Notes (Signed)
Patient escorted to CT with RN and ED tech on monitor.

## 2013-02-23 NOTE — ED Notes (Signed)
Called report to [phone],  They already received report from [facility].

## 2013-02-23 NOTE — ED Notes (Signed)
Admits to cocaine use in past.

## 2013-02-23 NOTE — ED Notes (Signed)
Report given to Tammy Sours, RN unit [address].  Report also given to [place], RN ED. Patient is to stop in ED prior to going to unit per Dr Drucilla Schmidt request.

## 2013-02-23 NOTE — ED Notes (Signed)
Patient speaking with teleneurology

## 2013-02-23 NOTE — ED Notes (Signed)
Patient's fiance, Meredith Pel, called by RN to inform her of patient's condition per patient request. Patient gave verbal consent to this RN and Colon Flattery, RN. Fiance updated.

## 2013-02-23 NOTE — ED Notes (Signed)
RN with patient throughout ED stay. Provided 1:1 Care to patient. Patient remained stable during ED stay.   Patient left ED at this time with [facility]. Report given to [facility] crew and to [place], [place] ICU for transport down to [facility].

## 2013-02-23 NOTE — ED Provider Notes (Signed)
CSN: [id number]     Arrival date & time 02/23/13  1532 History   First MD Initiated Contact with Patient 02/23/13 1538     Chief Complaint  Patient presents with  . Head Injury   (Consider location/radiation/quality/duration/timing/severity/associated sxs/prior Treatment) HPI Comments: Patient arrives via EMS after apparent head injury. He was playing basketball at the [facility] when he had an unwitnessed fall. Bystanders state patient stumbled and fell again and questionably loss consciousness. On EMS arrival patient was agitated and combative with decreased sensation and movement in his left side. He is oriented x2. He needs a lot of redirection to stay calm. He denies any previous medical history he denies taking any medications or drugs. Denies any chest pain, abdominal pain, back pain. Complains of pain in the back of his head. Denies any neck pain.  The history is provided by the patient and the EMS personnel. The history is limited by the condition of the patient.    History reviewed. No pertinent past medical history. History reviewed. No pertinent past surgical history. No family history on file. History  Substance Use Topics  . Smoking status: Never Smoker   . Smokeless tobacco: Not on file  . Alcohol Use: No    Review of Systems  Unable to perform ROS: Mental status change    Allergies  Review of patient's allergies indicates no known allergies.  Home Medications  No current outpatient prescriptions on file. BP 132/91  Pulse 66  Resp 22  SpO2 100% Physical Exam  Constitutional: He is oriented to person, place, and time. He appears well-developed and well-nourished.  Agitated, diaphoretic  HENT:  Head: Normocephalic.  Mouth/Throat: Oropharynx is clear and moist. No oropharyngeal exudate.  Questionable L facial droop  Eyes: Conjunctivae and EOM are normal. Pupils are equal, round, and reactive to light.  Neck:  C. collar in place. No midline tenderness or step-off.   Cardiovascular: Normal rate, regular rhythm and normal heart sounds.   Pulmonary/Chest: Effort normal and breath sounds normal. No respiratory distress.  Abdominal: Soft. There is no tenderness. There is no rebound and no guarding.  Genitourinary:  Normal rectal tone. No gross blood  Musculoskeletal: Normal range of motion. He exhibits no edema and no tenderness.  No T. or L-spine tenderness.  Neurological: He is alert and oriented to person, place, and time. No cranial nerve deficit.  Cranial nerves 3-12 intact Patient reports decreased sensation in left arm and left leg. Unable to hold left arm or left leg up off the bed. Full range of motion and strength in right arm and right leg  Skin: Skin is warm. He is diaphoretic.    ED Course  Procedures (including critical care time) Labs Review Labs Reviewed  CBC WITH DIFFERENTIAL  COMPREHENSIVE METABOLIC PANEL  TROPONIN I  URINALYSIS, ROUTINE W REFLEX MICROSCOPIC  URINE RAPID DRUG SCREEN (HOSP PERFORMED)  ETHANOL  ACETAMINOPHEN LEVEL  SALICYLATE LEVEL  LACTIC ACID, PLASMA  PROTIME-INR  APTT   Imaging Review Dg Chest 1 View  02/23/2013   CLINICAL DATA:  Head trauma secondary to a fall today.  EXAM: CHEST - 1 VIEW  COMPARISON:  None.  FINDINGS: The heart size and mediastinal contours are within normal limits. Both lungs are clear. The visualized skeletal structures are unremarkable.  IMPRESSION: Normal exam.   Electronically Signed   By: Geanie Cooley M.D.   On: 02/23/2013 15:58   Ct Head Wo Contrast  02/23/2013   CLINICAL DATA:  Post fall, now with altered  level of consciousness and left-sided weakness  EXAM: CT HEAD WITHOUT CONTRAST  CT CERVICAL SPINE WITHOUT CONTRAST  TECHNIQUE: Multidetector CT imaging of the head and cervical spine was performed following the standard protocol without intravenous contrast. Multiplanar CT image reconstructions of the cervical spine were also generated.  COMPARISON:  None.  FINDINGS: CT HEAD  FINDINGS  The examination is minimally degraded due to patient's head being held in a minimal degree of obliquity.  There is apparent asymmetric increased attenuation of the right MCA. This finding is without associated discrete blurring of the right-side gray-white differentiation although there does appear to be minimal (approximately 3 mm) of right to left midline shift (image 14, series 2) which may suggest mild diffuse right-sided cerebral edema.  No intraparenchymal or extra-axial mass or hemorrhage. There is very minimal effacement of the right lateral ventricle with otherwise normal size and configuration of the ventricles and basilar cisterns.  Limited visualization of the paranasal sinuses demonstrates polypoid mucosal thickening involving the right posterior ethmoidal air cells. No air-fluid levels. Regional soft tissues are normal. No displaced calvarial fracture.  CT CERVICAL SPINE FINDINGS  C1 to the superior endplate of T1 is image.  Normal alignment of the cervical spine. No anterolisthesis or retrolisthesis. The bilateral facets are normally aligned. The dens is normally positioned between the lateral mass of C1. Normal atlantodental and atlantoaxial articulations.  No fracture or static subluxation of the cervical spine. Cervical vertebral body heights are preserved. Prevertebral soft tissues are normal.  Intervertebral disc spaces are preserved.  Scattered shoddy bilateral cervical lymph nodes are individually not enlarged by size criteria. No bulky cervical lymphadenopathy on this noncontrast examination. Normal noncontrast appearance of the thyroid gland. Normal appearance of the visualized bilateral lung apices.  IMPRESSION: 1. Suspected right-sided hyperdense MCA sign with findings suggestive of mild diffuse right-sided cerebral edema with associated minimal (approximately 3 mm) of right to left midline shift. While there is relative preservation of the gray-white junction, these findings are  worrisome for an early right MCA distribution infarct. Further evaluation with MRI may be performed as clinically indicated. 2. No fracture or static subluxation of the cervical spine.  Critical Value/emergent results were called by telephone at the time of interpretation on 02/23/2013 at 4:06 PM to Dr.Kindrick Lankford , who verbally acknowledged these results.   Electronically Signed   By: Simonne Come M.D.   On: 02/23/2013 16:14   Ct Cervical Spine Wo Contrast  02/23/2013   CLINICAL DATA:  Post fall, now with altered level of consciousness and left-sided weakness  EXAM: CT HEAD WITHOUT CONTRAST  CT CERVICAL SPINE WITHOUT CONTRAST  TECHNIQUE: Multidetector CT imaging of the head and cervical spine was performed following the standard protocol without intravenous contrast. Multiplanar CT image reconstructions of the cervical spine were also generated.  COMPARISON:  None.  FINDINGS: CT HEAD FINDINGS  The examination is minimally degraded due to patient's head being held in a minimal degree of obliquity.  There is apparent asymmetric increased attenuation of the right MCA. This finding is without associated discrete blurring of the right-side gray-white differentiation although there does appear to be minimal (approximately 3 mm) of right to left midline shift (image 14, series 2) which may suggest mild diffuse right-sided cerebral edema.  No intraparenchymal or extra-axial mass or hemorrhage. There is very minimal effacement of the right lateral ventricle with otherwise normal size and configuration of the ventricles and basilar cisterns.  Limited visualization of the paranasal sinuses demonstrates polypoid mucosal thickening  involving the right posterior ethmoidal air cells. No air-fluid levels. Regional soft tissues are normal. No displaced calvarial fracture.  CT CERVICAL SPINE FINDINGS  C1 to the superior endplate of T1 is image.  Normal alignment of the cervical spine. No anterolisthesis or retrolisthesis. The  bilateral facets are normally aligned. The dens is normally positioned between the lateral mass of C1. Normal atlantodental and atlantoaxial articulations.  No fracture or static subluxation of the cervical spine. Cervical vertebral body heights are preserved. Prevertebral soft tissues are normal.  Intervertebral disc spaces are preserved.  Scattered shoddy bilateral cervical lymph nodes are individually not enlarged by size criteria. No bulky cervical lymphadenopathy on this noncontrast examination. Normal noncontrast appearance of the thyroid gland. Normal appearance of the visualized bilateral lung apices.  IMPRESSION: 1. Suspected right-sided hyperdense MCA sign with findings suggestive of mild diffuse right-sided cerebral edema with associated minimal (approximately 3 mm) of right to left midline shift. While there is relative preservation of the gray-white junction, these findings are worrisome for an early right MCA distribution infarct. Further evaluation with MRI may be performed as clinically indicated. 2. No fracture or static subluxation of the cervical spine.  Critical Value/emergent results were called by telephone at the time of interpretation on 02/23/2013 at 4:06 PM to Dr.Kinston Magnan , who verbally acknowledged these results.   Electronically Signed   By: Simonne Come M.D.   On: 02/23/2013 16:14    EKG Interpretation     Ventricular Rate:  72 PR Interval:  152 QRS Duration: 88 QT Interval:  384 QTC Calculation: 420 R Axis:   40 Text Interpretation:  Normal sinus rhythm with sinus arrhythmia Normal ECG No previous ECGs available No previous ECGs available            MDM  No diagnosis found. Fall during basketball with questionable syncope. Left-sided weakness and paresthesias. Patient protecting airway and following commands. ABCs intact. GCS 14  CT scan is negative for hemorrhage. However there is a right MCA sign with a minimal amount of cerebral edema. This was  discussed with Dr. Grace Isaac. Code stroke was called patient's last seen normal was about 3:15 PM.  Discussed with Dr. Roseanne Reno who feels patient qualifies for TPA. Patient last seen normal at 3:15 PM. He is not have any history of bleeding problems, recent surgeries or recent head trauma. No head trauma or prior stroke. No history of cancer or aneursym. No recent surgery.  No elevated BP. No use of anticoagulants. No seizure.  He has no chest pain or shortness of breath to suggest aortic dissection. No back pain. Peripheral pulses are equal. There is no hypertension.  Discussed with Dr. Roseanne Reno the possibility of aortic or carotid dissection. He agrees to proceed with TPA. Dr. Bethann Berkshire of the tele neurology consult agrees. Both neurologists feel that CT can be stopped if aortic dissection were to be found. Clinically patient is not presenting or dissection as he has had no chest pain, no back pain, normal blood pressure, and intact distal pulses. No widened mediastinum on chest x-ray.  D/w Dr. Ova Freshwater.  No aortic dissection.  No carotid dissection.  MCA occlusion seen.  Patient seems to have some improvement in LUE after tPA administration. D/w Dr. Roseanne Reno who will meet in [facility] ED and possibly have IR perform angiogram.   CRITICAL CARE Performed by: Glynn Octave Total critical care time: 45 Critical care time was exclusive of separately billable procedures and treating other patients. Critical care was necessary to treat  or prevent imminent or life-threatening deterioration. Critical care was time spent personally by me on the following activities: development of treatment plan with patient and/or surrogate as well as nursing, discussions with consultants, evaluation of patient's response to treatment, examination of patient, obtaining history from patient or surrogate, ordering and performing treatments and interventions, ordering and review of laboratory studies, ordering and review of  radiographic studies, pulse oximetry and re-evaluation of patient's condition.   Glynn Octave, MD 02/23/13 2137

## 2013-02-23 NOTE — ED Notes (Signed)
Anesthesia take over

## 2013-02-23 NOTE — Procedures (Signed)
S/P 4 vessel cerebral arteriogram. RT CFA approach . Findings. 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 S/P complete TICI 3 revascularization with  9 mg of IATPA and  2 passes with the Solitaire FR retrieval device.

## 2013-02-23 NOTE — Progress Notes (Signed)
Elink 1st hour revie  Calm and sedated on diprivan Sync with vent Vitals ok  Plan PRVC abg cxxr port stat CCM MD tos ee when able   Dr. Kalman Shan, M.D., [facility].C.P Pulmonary and Critical Care Medicine Staff Physician [facility] [place] [facility] and Critical [facility] Pager: [phone], If no answer or between  15:00h - 7:00h: call [phone]  02/23/2013 10:58 PM

## 2013-02-23 NOTE — ED Notes (Addendum)
Patient arrives via EMS from [facility] with c/o head injury. 1st fall not witnessed. Patient stumbled and fell again per witnesses. Patient with decreased sensation to left arm and leg. Moves left arm with extreme effort, left toes move with extreme effort. Patient agitated, moves right side with ease. Patient oriented x 4.

## 2013-02-23 NOTE — ED Notes (Signed)
Alteplase 7 mg bolus given per Teleneurology and infusion started at 70 mg/hr at 1659

## 2013-02-23 NOTE — ED Notes (Signed)
Patient with movement left lower leg at present.

## 2013-02-23 NOTE — Transfer of Care (Signed)
Immediate Anesthesia Transfer of Care Note  Patient: Marcus Hughes  Procedure(s) Performed: Procedure(s): RADIOLOGY WITH ANESTHESIA (N/A)  Patient Location: ICU  Anesthesia Type:General  Level of Consciousness: Patient remains intubated per anesthesia plan  Airway & Oxygen Therapy: Patient remains intubated per anesthesia plan and Patient placed on Ventilator (see vital sign flow sheet for setting)  Post-op Assessment: Report given to PACU RN and Post -op Vital signs reviewed and stable  Post vital signs: Reviewed and stable  Complications: No apparent anesthesia complications

## 2013-02-23 NOTE — Anesthesia Preprocedure Evaluation (Signed)
Anesthesia Evaluation  Patient identified by MRN, date of birth, ID band Patient unresponsive    Reviewed: Unable to perform ROS - Chart review only  Airway Mallampati: I      Dental   Pulmonary  breath sounds clear to auscultation        Cardiovascular Rhythm:Regular Rate:Tachycardia     Neuro/Psych CVA    GI/Hepatic   Endo/Other    Renal/GU      Musculoskeletal   Abdominal   Peds  Hematology   Anesthesia Other Findings   Reproductive/Obstetrics                           Anesthesia Physical Anesthesia Plan  ASA: III and emergent  Anesthesia Plan: General   Post-op Pain Management:    Induction: Intravenous  Airway Management Planned: Oral ETT  Additional Equipment:   Intra-op Plan:   Post-operative Plan: Possible Post-op intubation/ventilation  Informed Consent: I have reviewed the patients History and Physical, chart, labs and discussed the procedure including the risks, benefits and alternatives for the proposed anesthesia with the patient or authorized representative who has indicated his/her understanding and acceptance.     Plan Discussed with: CRNA and Surgeon  Anesthesia Plan Comments: (Called urgently to IR for code stroke)        Anesthesia Quick Evaluation

## 2013-02-24 ENCOUNTER — Inpatient Hospital Stay (HOSPITAL_COMMUNITY): Payer: BC Managed Care – PPO

## 2013-02-24 ENCOUNTER — Encounter (HOSPITAL_COMMUNITY): Payer: Self-pay | Admitting: Interventional Radiology

## 2013-02-24 DIAGNOSIS — E876 Hypokalemia: Secondary | ICD-10-CM | POA: Diagnosis present

## 2013-02-24 DIAGNOSIS — I6789 Other cerebrovascular disease: Secondary | ICD-10-CM

## 2013-02-24 DIAGNOSIS — J9600 Acute respiratory failure, unspecified whether with hypoxia or hypercapnia: Secondary | ICD-10-CM

## 2013-02-24 DIAGNOSIS — I6350 Cerebral infarction due to unspecified occlusion or stenosis of unspecified cerebral artery: Secondary | ICD-10-CM

## 2013-02-24 DIAGNOSIS — F1210 Cannabis abuse, uncomplicated: Secondary | ICD-10-CM | POA: Diagnosis present

## 2013-02-24 DIAGNOSIS — I639 Cerebral infarction, unspecified: Secondary | ICD-10-CM | POA: Diagnosis present

## 2013-02-24 LAB — CBC WITH DIFFERENTIAL/PLATELET
Basophils Absolute: 0 10*3/uL (ref 0.0–0.1)
Basophils Relative: 0 % (ref 0–1)
Eosinophils Relative: 2 % (ref 0–5)
HCT: 35.6 % — ABNORMAL LOW (ref 39.0–52.0)
MCH: 31.6 pg (ref 26.0–34.0)
MCHC: 36.5 g/dL — ABNORMAL HIGH (ref 30.0–36.0)
Monocytes Absolute: 0.3 10*3/uL (ref 0.1–1.0)
Neutro Abs: 4 10*3/uL (ref 1.7–7.7)
Neutrophils Relative %: 59 % (ref 43–77)
Platelets: 192 10*3/uL (ref 150–400)
RDW: 12.5 % (ref 11.5–15.5)

## 2013-02-24 LAB — HEMOGLOBIN A1C
Hgb A1c MFr Bld: 5.6 % (ref <5.7)
Mean Plasma Glucose: 114 mg/dL (ref <117)

## 2013-02-24 LAB — LIPID PANEL
LDL Cholesterol: 105 mg/dL — ABNORMAL HIGH (ref 0–99)
Total CHOL/HDL Ratio: 4.3 RATIO
VLDL: 22 mg/dL (ref 0–40)

## 2013-02-24 LAB — BASIC METABOLIC PANEL
Calcium: 7.8 mg/dL — ABNORMAL LOW (ref 8.4–10.5)
Chloride: 107 mEq/L (ref 96–112)
Creatinine, Ser: 1 mg/dL (ref 0.50–1.35)
GFR calc Af Amer: >90 mL/min (ref >90)
Potassium: 3.2 mEq/L — ABNORMAL LOW (ref 3.5–5.1)
Sodium: 138 mEq/L (ref 135–145)

## 2013-02-24 LAB — MRSA PCR SCREENING: MRSA by PCR: NEGATIVE

## 2013-02-24 MED ORDER — IBUPROFEN 400 MG PO TABS
400.0000 mg | ORAL_TABLET | Freq: Once | ORAL | Status: AC | PRN
  Administered 2013-02-24: 400 mg via ORAL
  Filled 2013-02-24 (×2): qty 1

## 2013-02-24 MED ORDER — FENTANYL BOLUS VIA INFUSION
50.0000 ug | INTRAVENOUS | Status: DC | PRN
  Filled 2013-02-24: qty 100

## 2013-02-24 MED ORDER — BIOTENE DRY MOUTH MT LIQD
15.0000 mL | Freq: Four times a day (QID) | OROMUCOSAL | Status: DC
  Administered 2013-02-24: 15 mL via OROMUCOSAL

## 2013-02-24 MED ORDER — ASPIRIN EC 325 MG PO TBEC
325.0000 mg | DELAYED_RELEASE_TABLET | Freq: Every day | ORAL | Status: DC
  Administered 2013-02-24 – 2013-02-26 (×3): 325 mg via ORAL
  Filled 2013-02-24 (×3): qty 1

## 2013-02-24 MED ORDER — FENTANYL CITRATE 0.05 MG/ML IJ SOLN: 12.5000 ug | INTRAMUSCULAR | Status: DC | PRN

## 2013-02-24 MED ORDER — FENTANYL CITRATE 0.05 MG/ML IJ SOLN: 50.0000 ug | Freq: Once | INTRAMUSCULAR | Status: DC

## 2013-02-24 MED ORDER — POTASSIUM CHLORIDE 10 MEQ/100ML IV SOLN
10.0000 meq | INTRAVENOUS | Status: AC
  Administered 2013-02-24 (×4): 10 meq via INTRAVENOUS
  Filled 2013-02-24 (×4): qty 100

## 2013-02-24 MED ORDER — SODIUM CHLORIDE 0.9 % IV SOLN
0.0000 ug/h | INTRAVENOUS | Status: DC
  Administered 2013-02-24: 50 ug/h via INTRAVENOUS
  Filled 2013-02-24: qty 50

## 2013-02-24 MED ORDER — CHLORHEXIDINE GLUCONATE 0.12 % MT SOLN
15.0000 mL | Freq: Two times a day (BID) | OROMUCOSAL | Status: DC
  Administered 2013-02-24: 15 mL via OROMUCOSAL
  Filled 2013-02-24: qty 15

## 2013-02-24 MED ORDER — INFLUENZA VAC SPLIT QUAD 0.5 ML IM SUSP
0.5000 mL | INTRAMUSCULAR | Status: DC
  Filled 2013-02-24: qty 0.5

## 2013-02-24 MED ORDER — STROKE: EARLY STAGES OF RECOVERY BOOK
Freq: Once | Status: AC
  Administered 2013-02-24: 11:00:00
  Filled 2013-02-24: qty 1

## 2013-02-24 NOTE — Anesthesia Postprocedure Evaluation (Signed)
  Anesthesia Post-op Note  Patient: Marcus Hughes  Procedure(s) Performed: Procedure(s): RADIOLOGY WITH ANESTHESIA (N/A)  Patient Location: NICU  Anesthesia Type:General  Level of Consciousness: sedated, lethargic and Patient remains intubated per anesthesia plan  Airway and Oxygen Therapy: Patient remains intubated per anesthesia plan and Patient placed on Ventilator (see vital sign flow sheet for setting)  Post-op Pain: none  Post-op Assessment: Post-op Vital signs reviewed  Post-op Vital Signs: stable  Complications: No apparent anesthesia complications

## 2013-02-24 NOTE — Plan of Care (Signed)
Problem: tPA Day Progression Outcomes-Only if tPA administered Goal: Pre tPA foley catheter inserted Outcome: Not Met (add Reason) Foley not inserted at [facility]

## 2013-02-24 NOTE — Progress Notes (Signed)
No SBT/weaning done at this time due to pt being sedated because he gets severely agitated and he just had his femoral sheath removed and needs to lie still and flat. No complications noted. RT will monitor.

## 2013-02-24 NOTE — Progress Notes (Signed)
OT Cancellation Note  Patient Details Name: Marcus Hughes MRN: [id number] DOB: 11/08/1985   Cancelled Treatment:    Reason Eval/Treat Not Completed: Medical issues which prohibited therapy. Pt on bedrest due to Bil femoral sheaths, will have out sometime later this AM and then will be on bedrest for 4 hours after that. Will re-attempt eval tomorrow.  Evette Georges [phone] 02/24/2013, 8:21 AM

## 2013-02-24 NOTE — Progress Notes (Signed)
  Echocardiogram 2D Echocardiogram has been performed.  Georgian Co 02/24/2013, 4:37 PM

## 2013-02-24 NOTE — Procedures (Signed)
Extubation Procedure Note  Patient Details:   Name: DAESEAN LAZARZ DOB: 1985/12/16 MRN: [id number]   Airway Documentation:     Evaluation  O2 sats: stable throughout Complications: No apparent complications Patient did tolerate procedure well. Bilateral Breath Sounds: Clear Suctioning: Airway;Oral Yes Pt extubated to 2L [place] per MD order. No complications noted. RT will monitor.   Kristian Covey Arlene 02/24/2013, 1:55 PM

## 2013-02-24 NOTE — Progress Notes (Signed)
130 cc Fentanyl drip wasted in sink with Consuella Lose, Charity fundraiser.

## 2013-02-24 NOTE — Progress Notes (Signed)
eLink Physician-Brief Progress Note Patient Name: Marcus Hughes DOB: Feb 27, 1986 MRN: [id number]  Date of Service  02/24/2013   HPI/Events of Note   Hypokalemia  eICU Interventions  Potassium replaced   Intervention Category Intermediate Interventions: Electrolyte abnormality - evaluation and management  DETERDING,ELIZABETH 02/24/2013, 3:15 AM

## 2013-02-24 NOTE — Consult Note (Signed)
Name: Marcus Hughes MRN: [id number] DOB: April 17, 1985    LOS: 1  Referring Provider:  Dr. Roseanne Reno Reason for Referral:  Ventilatory management  PULMONARY / CRITICAL CARE MEDICINE  HPI:  [name] is a 27 year old African American male with no past medical history who developed sudden onset weakness of his left side on the day of admission.  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  He was taken to interventional radiology and underwent targeted thrombectomy.  [facility] M. is consultation for ventilator management post procedure.  History reviewed. No pertinent past medical history. History reviewed. No pertinent past surgical history. Prior to Admission medications   Not on File   Allergies No Known Allergies  Family History No family history on file. Social History  reports that he has never smoked. He does not have any smokeless tobacco history on file. He reports that he uses illicit drugs (Cocaine and Marijuana). He reports that he does not drink alcohol.  Review Of Systems:  The review of systems cannot be obtained as patient is intubated and sedated   Brief patient description:  27 year-old status post acute stroke and area of thrombectomy  Events Since Admission: Intubated November 16  Current Status: Guarded  Vital Signs: Temp:  [94.3 F (34.6 C)-97.6 F (36.4 C)] 97.6 F (36.4 C) (11/17 0000) Pulse Rate:  [59-88] 71 (11/17 0445) Resp:  [8-25] 18 (11/17 0445) BP: (99-169)/(56-100) 113/68 mmHg (11/17 0430) SpO2:  [94 %-100 %] 100 % (11/17 0445) Arterial Line BP: (105-155)/(53-89) 129/76 mmHg (11/17 0445) FiO2 (%):  [21 %-50 %] 30 % (11/17 0347) Weight:  [86.183 kg (190 lb)-93.8 kg (206 lb 12.7 oz)] 93.8 kg (206 lb 12.7 oz) (11/16 2216)  Physical Examination: General:  Laying in bed, intubated, sedated Neuro:  Does not response to painful or verbal stimuli HEENT:  Pupils 3 mm and equal Neck:  Supple no masses Cardiovascular:  Normal rate regular rhythm, no murmurs  rubs or gallops Lungs:  Clear to auscultation bilaterally Abdomen:  Soft nontender nondistended positive bowel sounds no hepatosplenomegaly Musculoskeletal:  No joint abnormalities, no clubbing, no edema Skin:   intact  Active Problems:   Acute embolic stroke   ASSESSMENT AND PLAN  PULMONARY  Recent Labs Lab 02/23/13 2257  PHART 7.427  PCO2ART 28.2*  PO2ART 118.0*  HCO3 18.8*  O2SAT 99.0   Ventilator Settings: Vent Mode:  [-] PRVC FiO2 (%):  [21 %-50 %] 30 % Set Rate:  [18 bmp] 18 bmp Vt Set:  [550 mL] 550 mL PEEP:  [5 cmH20] 5 cmH20 Plateau Pressure:  [15 cmH20-17 cmH20] 17 cmH20 CXR:  Endotracheal tube in good position, possible left lower lobe atelectasis ETT:  8.0  A:  Intubated for airway protection during procedure P:   Wean ventilator as able Spontaneous breathing trial and patient is awake and alert  RENAL  Recent Labs Lab 02/23/13 1602 02/24/13 0155  NA 137 138  K 3.2* 3.2*  CL 101 107  CO2 24 20  BUN 17 11  CREATININE 1.29 1.00  CALCIUM 9.7 7.8*   Intake/Output     11/16 0701 - 11/17 0700   I.V. (mL/kg) 1831.9 (19.5)   IV Piggyback 200   Total Intake(mL/kg) 2031.9 (21.7)   Net +2031.9       Urine Occurrence 175 x    Foley:  11/16  A:  Hypokalemia P:   Replete electrolytes as needed  NEUROLOGIC  A:  Acute embolic stroke P:   Management per  neurology  BEST PRACTICE / DISPOSITION Level of Care:  ICU Primary Service:  Neurology Consultants:  [facility] M. Code Status:  Full Diet:  N.p.o. DVT Px:  Heparin SCDs GI Px:  PPI Skin Integrity:  Good Social / Family:  Family and not at bedside at the time of my visit  I spent 35 minutes of critical care time in the care of this patient seperate from procedures which are documented elsewhere.  Carolan Clines., M.D. Pulmonary and Critical Care Medicine [facility] Pager: [phone]  02/24/2013, 6:46 AM

## 2013-02-24 NOTE — Progress Notes (Signed)
eLink Physician-Brief Progress Note Patient Name: Marcus Hughes DOB: March 12, 1986 MRN: [id number]  Date of Service  02/24/2013   HPI/Events of Note  Currently on high dose propofol with prn fentanyl.  This is not holding the patient.  Whenever propofol is reduced patient is agitated.   eICU Interventions  Plan: Goal max dose of propofol is 50 mcg Start fentanyl cont gtt for sedation   Intervention Category Minor Interventions: Agitation / anxiety - evaluation and management  DETERDING,ELIZABETH 02/24/2013, 3:44 AM

## 2013-02-24 NOTE — Progress Notes (Signed)
R sheath removed at 0910/ L sheath removed at 0928. V pad and pressure dressing placed on site. No hematoma formation. 10 lb sand bag applied to both groin sites. No complications at this time. Will continue to monitor pt.  Cyndie Chime [place]

## 2013-02-24 NOTE — Progress Notes (Signed)
SLP Cancellation Note  Patient Details Name: JAYMEN FETCH MRN: [id number] DOB: June 14, 1985   Cancelled treatment:       Reason Eval/Treat Not Completed: Patient not medically ready. Per chart pt weaning from ventilator, will follow for readiness. Harlon Ditty, [place] CCC-SLP [phone]    Claudine Mouton 02/24/2013, 7:38 AM

## 2013-02-24 NOTE — ED Notes (Signed)
recannualization .

## 2013-02-24 NOTE — Progress Notes (Signed)
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 Remains sedated due to agitation Sheaths just pulled  Objective: Physical Exam: BP 104/61  Pulse 66  Temp(Src) 97.8 F (36.6 C) (Oral)  Resp 18  Ht 5\' 11"  (1.803 m)  Wt 206 lb 12.7 oz (93.8 kg)  BMI 28.85 kg/m2  SpO2 100% Intubated sedated (B)groin dressings intact, sandbags present Feet warm, good distal pulses    Labs: CBC  Recent Labs  02/23/13 1602 02/24/13 0155  WBC 5.0 6.7  HGB 14.3 13.0  HCT 39.9 35.6*  PLT 212 192   BMET  Recent Labs  02/23/13 1602 02/24/13 0155  NA 137 138  K 3.2* 3.2*  CL 101 107  CO2 24 20  GLUCOSE 125* 87  BUN 17 11  CREATININE 1.29 1.00  CALCIUM 9.7 7.8*   LFT  Recent Labs  02/23/13 1602  PROT 7.8  ALBUMIN 4.6  AST 47*  ALT 31  ALKPHOS 41  BILITOT 0.6   PT/INR  Recent Labs  02/23/13 1620  LABPROT 12.8  INR 0.98     Studies/Results: Ct Angio Head W/cm &/or Wo Cm  02/23/2013   CLINICAL DATA:  Left-sided weakness.  Fall.  Abnormal head CT.  EXAM: CT ANGIOGRAPHY HEAD AND NECK  TECHNIQUE: Multidetector CT imaging of the head and neck was performed using the standard protocol during bolus administration of intravenous contrast. Multiplanar CT image reconstructions including MIPs were obtained to evaluate the vascular anatomy. Carotid stenosis measurements (when applicable) are obtained utilizing NASCET criteria, using the distal internal carotid diameter as the denominator.  CONTRAST:  OMNIPAQUE IOHEXOL 350 MG/ML SOLN  COMPARISON:  Head CT same day  FINDINGS: CTA HEAD FINDINGS  Both internal carotid arteries are patent through the skullbase. Both carotid siphon regions appear normal. On the left, the anterior and middle cerebral vessels are normal without proximal stenosis, aneurysm or vascular malformation. On the right, there is occlusion of the middle cerebral artery at its origin with reconstitution after 1 cm.  Both vertebral arteries are  patent to the basilar. No basilar stenosis. Posterior circulation branch vessels are patent.  There is mild swelling of the right hemisphere.  No hemorrhage.  Review of the MIP images confirms the above findings.  CTA NECK FINDINGS  Lung apices are clear. Branching pattern of the brachiocephalic vessels from the arch is normal. No origin stenoses. Both common carotid arteries are widely patent to their respective bifurcation. Both carotid bifurcations are normal. Both cervical internal carotid arteries are normal.  Both vertebral arteries are patent and normal. The right vertebral artery is small. Left vertebral artery is dominant. No vertebral disease.  Review of the MIP images confirms the above findings.  IMPRESSION: Normal CT angiography of the neck vessels.  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 Reconstitution 1 cm distal to that.  Mild swelling of the right hemisphere.  No hemorrhage.   Electronically Signed   By: Paulina Fusi M.D.   On: 02/23/2013 18:43   Dg Chest 1 View  02/23/2013   CLINICAL DATA:  Head trauma secondary to a fall today.  EXAM: CHEST - 1 VIEW  COMPARISON:  None.  FINDINGS: The heart size and mediastinal contours are within normal limits. Both lungs are clear. The visualized skeletal structures are unremarkable.  IMPRESSION: Normal exam.   Electronically Signed   By: Geanie Cooley M.D.   On: 02/23/2013 15:58   Ct Head Wo Contrast  02/23/2013  CLINICAL DATA:  CVA.  EXAM: CT HEAD WITHOUT CONTRAST  TECHNIQUE: Contiguous axial images were obtained from the base of the skull through the vertex without intravenous contrast.  COMPARISON:  02/23/2013 at 5:36 p.m.  FINDINGS: Interval intubation with retention of fluid in the nasopharynx.  There is a sub cm area of high attenuation in the posterior aspect of the right putamen. Sulci of the right cerebral hemisphere appear slightly more effaced than on the left, suggesting edema. Slightly prominent vessels in the right  sylvian fissure, likely intravascular enhancement in the setting of recent infarction. No hydrocephalus.  These results were called by telephone at the time of interpretation on 02/23/2013 at 10:42 PM to Dr. Pincus Large, who verbally acknowledged these results.  IMPRESSION: 1. Sub cm high attenuation area in the posterior right putamen, contrast staining versus small petechial hemorrhage. Followup CT should be able to differentiate. 2. No large vessel territory infarct currently apparent.   Electronically Signed   By: Tiburcio Pea M.D.   On: 02/23/2013 22:47   Ct Head Wo Contrast  02/23/2013   CLINICAL DATA:  Post fall, now with altered level of consciousness and left-sided weakness  EXAM: CT HEAD WITHOUT CONTRAST  CT CERVICAL SPINE WITHOUT CONTRAST  TECHNIQUE: Multidetector CT imaging of the head and cervical spine was performed following the standard protocol without intravenous contrast. Multiplanar CT image reconstructions of the cervical spine were also generated.  COMPARISON:  None.  FINDINGS: CT HEAD FINDINGS  The examination is minimally degraded due to patient's head being held in a minimal degree of obliquity.  There is apparent asymmetric increased attenuation of the right MCA. This finding is without associated discrete blurring of the right-side gray-white differentiation although there does appear to be minimal (approximately 3 mm) of right to left midline shift (image 14, series 2) which may suggest mild diffuse right-sided cerebral edema.  No intraparenchymal or extra-axial mass or hemorrhage. There is very minimal effacement of the right lateral ventricle with otherwise normal size and configuration of the ventricles and basilar cisterns.  Limited visualization of the paranasal sinuses demonstrates polypoid mucosal thickening involving the right posterior ethmoidal air cells. No air-fluid levels. Regional soft tissues are normal. No displaced calvarial fracture.  CT CERVICAL SPINE FINDINGS   C1 to the superior endplate of T1 is image.  Normal alignment of the cervical spine. No anterolisthesis or retrolisthesis. The bilateral facets are normally aligned. The dens is normally positioned between the lateral mass of C1. Normal atlantodental and atlantoaxial articulations.  No fracture or static subluxation of the cervical spine. Cervical vertebral body heights are preserved. Prevertebral soft tissues are normal.  Intervertebral disc spaces are preserved.  Scattered shoddy bilateral cervical lymph nodes are individually not enlarged by size criteria. No bulky cervical lymphadenopathy on this noncontrast examination. Normal noncontrast appearance of the thyroid gland. Normal appearance of the visualized bilateral lung apices.  IMPRESSION: 1. Suspected right-sided hyperdense MCA sign with findings suggestive of mild diffuse right-sided cerebral edema with associated minimal (approximately 3 mm) of right to left midline shift. While there is relative preservation of the gray-white junction, these findings are worrisome for an early right MCA distribution infarct. Further evaluation with MRI may be performed as clinically indicated. 2. No fracture or static subluxation of the cervical spine.  Critical Value/emergent results were called by telephone at the time of interpretation on 02/23/2013 at 4:06 PM to Dr.STEPHEN RANCOUR , who verbally acknowledged these results.   Electronically Signed   By: Simonne Come  M.D.   On: 02/23/2013 16:14   Ct Angio Neck W/cm &/or Wo/cm  02/23/2013   CLINICAL DATA:  Left-sided weakness.  Fall.  Abnormal head CT.  EXAM: CT ANGIOGRAPHY HEAD AND NECK  TECHNIQUE: Multidetector CT imaging of the head and neck was performed using the standard protocol during bolus administration of intravenous contrast. Multiplanar CT image reconstructions including MIPs were obtained to evaluate the vascular anatomy. Carotid stenosis measurements (when applicable) are obtained utilizing NASCET  criteria, using the distal internal carotid diameter as the denominator.  CONTRAST:  OMNIPAQUE IOHEXOL 350 MG/ML SOLN  COMPARISON:  Head CT same day  FINDINGS: CTA HEAD FINDINGS  Both internal carotid arteries are patent through the skullbase. Both carotid siphon regions appear normal. On the left, the anterior and middle cerebral vessels are normal without proximal stenosis, aneurysm or vascular malformation. On the right, there is occlusion of the middle cerebral artery at its origin with reconstitution after 1 cm.  Both vertebral arteries are patent to the basilar. No basilar stenosis. Posterior circulation branch vessels are patent.  There is mild swelling of the right hemisphere.  No hemorrhage.  Review of the MIP images confirms the above findings.  CTA NECK FINDINGS  Lung apices are clear. Branching pattern of the brachiocephalic vessels from the arch is normal. No origin stenoses. Both common carotid arteries are widely patent to their respective bifurcation. Both carotid bifurcations are normal. Both cervical internal carotid arteries are normal.  Both vertebral arteries are patent and normal. The right vertebral artery is small. Left vertebral artery is dominant. No vertebral disease.  Review of the MIP images confirms the above findings.  IMPRESSION: Normal CT angiography of the neck vessels.  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 Reconstitution 1 cm distal to that.  Mild swelling of the right hemisphere.  No hemorrhage.   Electronically Signed   By: Paulina Fusi M.D.   On: 02/23/2013 18:43   Ct Chest W Contrast  02/23/2013   CLINICAL DATA:  Fall, altered level of consciousness  EXAM: CT CHEST WITH CONTRAST  TECHNIQUE: Multidetector CT imaging of the chest was performed during intravenous contrast administration.  CONTRAST:  OMNIPAQUE IOHEXOL 350 MG/ML SOLN  COMPARISON:  CT head and neck are dictated separately under a dedicated report  FINDINGS: Heart size at upper  limits of normal. No pericardial or pleural effusion. Small AP window lymph node measuring approximately 4 mm incidentally noted. Symmetric bilateral retroareolar soft tissue density may represent gynecomastia, which could be asymptomatic could streak artifact from the patient's arms overlies the thorax.  Motion artifact degrades imaging at multiple levels. Allowing for this, the lungs are clear. No pneumothorax. Central airways are patent. No acute osseous abnormality.  IMPRESSION: No acute intrathoracic abnormality.   Electronically Signed   By: Christiana Pellant M.D.   On: 02/23/2013 17:47   Ct Cervical Spine Wo Contrast  02/23/2013   CLINICAL DATA:  Post fall, now with altered level of consciousness and left-sided weakness  EXAM: CT HEAD WITHOUT CONTRAST  CT CERVICAL SPINE WITHOUT CONTRAST  TECHNIQUE: Multidetector CT imaging of the head and cervical spine was performed following the standard protocol without intravenous contrast. Multiplanar CT image reconstructions of the cervical spine were also generated.  COMPARISON:  None.  FINDINGS: CT HEAD FINDINGS  The examination is minimally degraded due to patient's head being held in a minimal degree of obliquity.  There is apparent asymmetric increased attenuation of the right MCA. This finding  is without associated discrete blurring of the right-side gray-white differentiation although there does appear to be minimal (approximately 3 mm) of right to left midline shift (image 14, series 2) which may suggest mild diffuse right-sided cerebral edema.  No intraparenchymal or extra-axial mass or hemorrhage. There is very minimal effacement of the right lateral ventricle with otherwise normal size and configuration of the ventricles and basilar cisterns.  Limited visualization of the paranasal sinuses demonstrates polypoid mucosal thickening involving the right posterior ethmoidal air cells. No air-fluid levels. Regional soft tissues are normal. No displaced calvarial  fracture.  CT CERVICAL SPINE FINDINGS  C1 to the superior endplate of T1 is image.  Normal alignment of the cervical spine. No anterolisthesis or retrolisthesis. The bilateral facets are normally aligned. The dens is normally positioned between the lateral mass of C1. Normal atlantodental and atlantoaxial articulations.  No fracture or static subluxation of the cervical spine. Cervical vertebral body heights are preserved. Prevertebral soft tissues are normal.  Intervertebral disc spaces are preserved.  Scattered shoddy bilateral cervical lymph nodes are individually not enlarged by size criteria. No bulky cervical lymphadenopathy on this noncontrast examination. Normal noncontrast appearance of the thyroid gland. Normal appearance of the visualized bilateral lung apices.  IMPRESSION: 1. Suspected right-sided hyperdense MCA sign with findings suggestive of mild diffuse right-sided cerebral edema with associated minimal (approximately 3 mm) of right to left midline shift. While there is relative preservation of the gray-white junction, these findings are worrisome for an early right MCA distribution infarct. Further evaluation with MRI may be performed as clinically indicated. 2. No fracture or static subluxation of the cervical spine.  Critical Value/emergent results were called by telephone at the time of interpretation on 02/23/2013 at 4:06 PM to Dr.STEPHEN RANCOUR , who verbally acknowledged these results.   Electronically Signed   By: Simonne Come M.D.   On: 02/23/2013 16:14   Dg Chest Port 1 View  02/23/2013   CLINICAL DATA:  Stroke and respiratory failure  EXAM: PORTABLE CHEST - 1 VIEW  COMPARISON:  02/23/2013 at 3:49 p.m.  FINDINGS: Endotracheal tube ends in the lower thoracic trachea, approximately 3 cm above the carina.  Heart size within normal limits for technique. New retrocardiac opacity, obscuring the diaphragm, with subtle left-sided volume loss. No edema, effusion, or pneumothorax.  Evidence of  prior surgery to the right anterior glenoid.  IMPRESSION: 1. Endotracheal tube in good position. 2. Left lower lobe atelectasis or collapse.   Electronically Signed   By: Tiburcio Pea M.D.   On: 02/23/2013 23:22    Assessment/Plan: CVA s/p Rt MCA clot retrieval and IA TPA Sheaths out To remain intubated/sedated for now. Reassess later today Will report to [facility].    LOS: 1 day    Brayton El PA-C 02/24/2013 10:13 AM

## 2013-02-24 NOTE — Progress Notes (Signed)
UR completed.  Dagon Budai, RN BSN [facility] CCM Trauma/Neuro ICU Case Manager [phone]  

## 2013-02-24 NOTE — Progress Notes (Addendum)
Vent changes made by Dr. Sung Amabile. No complications noted. RT will monitor.

## 2013-02-24 NOTE — Progress Notes (Signed)
Stroke Team Progress Note  HISTORY Marcus Hughes is an 27 y.o. male with no known previous medical history who experienced sudden onset of weakness involving his left arm and left leg about 3:00 today 02/23/2013. Patient fell to the ground and describes the fall as tripping over his leg. He noticed difficulty with use of his left extremities prior to falling. He hit his head when he fell but doesn't think he lost consciousness. He presented to [facility] where CT scan of his head showed no acute intracranial abnormality. There was an equivocal increase in density of his right MCA, however. He was deemed a candidate for TPA which was administered at [facility] prior to obtaining a CT angiogram of the head and neck, and subsequent transfer to [facility] for further management. 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 NIH stroke score on arrival to [facility] was 12. Formal catheter angiography was performed by Dr. Corliss Skains which showed a complete occlusion of the RT MCA at its origin. Complete TICI 3 revascularization was obtained with 9 mg of IATPA and 2 passes with the Solitaire FR retrieval device. He was admitted to the neuro ICU for further evaluation and treatment.  SUBJECTIVE No family is at the bedside.  He is sedated and intubated.  OBJECTIVE Most recent Vital Signs: Filed Vitals:   02/24/13 0615 02/24/13 0630 02/24/13 0645 02/24/13 0700  BP:  109/70  108/66  Pulse: 74 76 74 70  Temp:      TempSrc:      Resp: 18 18 18 18   Height:      Weight:      SpO2: 100% 100% 100% 100%   CBG (last 3)  No results found for this basename: GLUCAP,  in the last 72 hours  IV Fluid Intake:   . sodium chloride 1,000 mL (02/24/13 0013)  . sodium chloride 25 mL/hr at 02/24/13 0700  . fentaNYL infusion INTRAVENOUS 150 mcg/hr (02/24/13 0700)  . niCARDipine    . propofol 50 mcg/kg/min (02/24/13 0753)    MEDICATIONS  . sodium chloride  250 mL Intravenous Once  . antiseptic oral rinse  15  mL Mouth Rinse QID  . chlorhexidine  15 mL Mouth Rinse BID  . [START ON 02/25/2013] influenza vac split quadrivalent PF  0.5 mL Intramuscular Tomorrow-1000  . nitroGLYCERIN      . pantoprazole (PROTONIX) IV  40 mg Intravenous QHS  . potassium chloride  10 mEq Intravenous Q1 Hr x 4   PRN:  acetaminophen, acetaminophen, fentaNYL, labetalol, ondansetron (ZOFRAN) IV  Diet:  NPO  Activity:  Bedrest DVT Prophylaxis:  SCDs   CLINICALLY SIGNIFICANT STUDIES Basic Metabolic Panel:  Recent Labs Lab 02/23/13 1602 02/24/13 0155  NA 137 138  K 3.2* 3.2*  CL 101 107  CO2 24 20  GLUCOSE 125* 87  BUN 17 11  CREATININE 1.29 1.00  CALCIUM 9.7 7.8*   Liver Function Tests:  Recent Labs Lab 02/23/13 1602  AST 47*  ALT 31  ALKPHOS 41  BILITOT 0.6  PROT 7.8  ALBUMIN 4.6   CBC:  Recent Labs Lab 02/23/13 1602 02/24/13 0155  WBC 5.0 6.7  NEUTROABS 2.4 4.0  HGB 14.3 13.0  HCT 39.9 35.6*  MCV 86.9 86.4  PLT 212 192   Coagulation:  Recent Labs Lab 02/23/13 1620  LABPROT 12.8  INR 0.98   Cardiac Enzymes:  Recent Labs Lab 02/23/13 1602  TROPONINI <0.30   Urinalysis:  Recent Labs Lab 02/23/13  1703  COLORURINE YELLOW  LABSPEC 1.025  PHURINE 6.0  GLUCOSEU NEGATIVE  HGBUR NEGATIVE  BILIRUBINUR NEGATIVE  KETONESUR NEGATIVE  PROTEINUR NEGATIVE  UROBILINOGEN 0.2  NITRITE NEGATIVE  LEUKOCYTESUR NEGATIVE   Lipid Panel    Component Value Date/Time   CHOL 166 02/24/2013 0155   TRIG 112 02/24/2013 0155   HDL 39* 02/24/2013 0155   CHOLHDL 4.3 02/24/2013 0155   VLDL 22 02/24/2013 0155   LDLCALC 105* 02/24/2013 0155   HgbA1C  No results found for this basename: HGBA1C    Urine Drug Screen:     Component Value Date/Time   LABOPIA NONE DETECTED 02/23/2013 1703   COCAINSCRNUR NONE DETECTED 02/23/2013 1703   LABBENZ NONE DETECTED 02/23/2013 1703   AMPHETMU NONE DETECTED 02/23/2013 1703   THCU POSITIVE* 02/23/2013 1703   LABBARB NONE DETECTED 02/23/2013 1703     Alcohol Level:  Recent Labs Lab 02/23/13 1602  ETH <11    CT of the brain   02/23/2013   1. Sub cm high attenuation area in the posterior right putamen, contrast staining versus small petechial hemorrhage. Followup CT should be able to differentiate. 2. No large vessel territory infarct currently apparent.  02/23/2013    1. Suspected right-sided hyperdense MCA sign with findings suggestive of mild diffuse right-sided cerebral edema with associated minimal (approximately 3 mm) of right to left midline shift. While there is relative preservation of the gray-white junction, these findings are worrisome for an early right MCA distribution infarct. Further evaluation with MRI may be performed as clinically indicated. 2. No fracture or static subluxation of the cervical spine.    CT Angio Head 02/23/2013   Occlusion of the right middle cerebral artery at its origin. Reconstitution 1 cm distal to that.  Mild swelling of the right hemisphere.  No hemorrhage.     CT Angio Neck   02/23/2013      Normal CT angiography of the neck vessels.    CT Cervical Spine 02/23/2013   1. Suspected right-sided hyperdense MCA sign with findings suggestive of mild diffuse right-sided cerebral edema with associated minimal (approximately 3 mm) of right to left midline shift. While there is relative preservation of the gray-white junction, these findings are worrisome for an early right MCA distribution infarct. Further evaluation with MRI may be performed as clinically indicated. 2. No fracture or static subluxation of the cervical spine.    Cerebral Angiogram RT CFA approach.  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 S/P complete TICI 3 revascularization with 9 mg of IATPA and 2 passes with the Solitaire FR retrieval device.  MRI of the brain    MRA of the brain  See angio  2D Echocardiogram    Carotid Doppler  See angio  CXR   02/23/2013   Normal exam.   02/23/2013    1. Endotracheal tube in good position. 2.  Left lower lobe atelectasis or collapse.    CT Chest W Contrast 02/23/2013  No acute intrathoracic abnormality.     EKG  normal sinus rhythm.   Therapy Recommendations   Physical Exam   Young caucasian male who is sedated and intubated hence exam is limited. . Afebrile. Head is nontraumatic. Neck is supple without bruit.  . Cardiac exam no murmur or gallop. Lungs are clear to auscultation. Distal pulses are well felt. Has bilateral groin sheaths. Neurological Exam :  Lethargic sedated. Does not respond to auditory or tactile stimuli. Pupils 3 mm reactive. Left gaze preference. Dolls eye movements are  sluggish. Fundi could not be visualized. Has right lower facial weakness. Tongue midline. Purposeful movements on the left side against gravity. Dense right hemiplegia. Plantars right is equivocal and left is downgoing. ASSESSMENT Marcus Hughes is a 27 y.o. male presenting with left sided weakness while playing basketball, he fell, ? LOC. Status post IV t-PA 02/23/2013 at 1659. Cerebral angio confrimed complete occlusion of R MCA at the origin. He was revascularized with IA tPA and mechanical thrombectomy with Solitaire. Imaging confirms a right MCA infarct. Infarct felt to be embolic secondary to unknown source.  On no antithrombotics prior to admission. Now on no antithrombotics as within 24h of tPA for secondary stroke prevention. Patient with resultant VDRF, sheath(s) present, left hemiparesis, dysphagia. Work up underway.   VDRF post neurointervention Hyperlipidemia, LDL 105, on no statin PTA, now on no statin, goal LDL < 100 (< 70 for diabetics)  Hypokalemia, 3.2 replaced by eLink  Hx Cocaine use  Marijuana use, positive on admission  etoh use  Hospital day # 1  TREATMENT/PLAN  Add aspirin for secondary stroke prevention at 24h if imaging negative for hemorrhage.  Ok to d/c sheath(s)  Extubation per CCM  F/u 24h imaging for cerebral hemorrhage/stroke  F/u 2D,  HgbA1c  Check LE dopplers  Check Hypercoagulable panel (minus factor 5 leiden and beta-2-glycoprotein as these test for venous clots) and vasculitic labs (C3, C4, CH50, ANA, ESR) and RPR tomororw  Plan TEE during hospitalization  Add statin once able to swallow  F/u HgbA1c  SHARON BIBY, MSN, RN, ANVP-BC, ANP-BC, GNP-BC [facility] Pager: [phone] 02/24/2013 8:11 AM This patient is critically ill and at significant risk of neurological worsening, death and care requires constant monitoring of vital signs, hemodynamics,respiratory and cardiac monitoring,review of multiple databases, neurological assessment, discussion with family, other specialists and medical decision making of high complexity. I spent 30 minutes of neurocritical care time  in the care of  this patient. I have personally obtained a history, examined the patient, evaluated imaging results, and formulated the assessment and plan of care. I agree with the above. Delia Heady, MD

## 2013-02-24 NOTE — Progress Notes (Signed)
PT Cancellation Note  Patient Details Name: ARAGORN RECKER MRN: [id number] DOB: 21-Jul-1985   Cancelled Treatment:    Reason Eval/Treat Not Completed: Patient not medically ready. Pt currently with Bil femoral sheaths and on bedrest. RN suspects pt to have these sheaths removed sometime this AM however pt will then be on bedrest x 4 hours after removal. PT to re-attempt as able.   Marcene Brawn 02/24/2013, 8:24 AM

## 2013-02-24 NOTE — Progress Notes (Signed)
Name: Marcus Hughes MRN: [id number] DOB: 04-02-1986    LOS: 1  Referring Provider:  Dr. Roseanne Reno Reason for Referral:  Ventilatory management  PULMONARY / CRITICAL CARE MEDICINE  HPI:  [name] is a 27 year old African American male with no past medical history who developed sudden onset weakness of his left side on the day of admission.  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  He was taken to interventional radiology and underwent targeted thrombectomy.  PCCM. is consultation for ventilator management post procedure.  SUBJ: Passed SBT. + F/C. Extubated and looks good  Vital Signs: Temp:  [94.3 F (34.6 C)-98.3 F (36.8 C)] 98.1 F (36.7 C) (11/17 1538) Pulse Rate:  [59-109] 92 (11/17 1400) Resp:  [8-25] 16 (11/17 1600) BP: (99-169)/(56-100) 124/65 mmHg (11/17 1600) SpO2:  [76 %-100 %] 100 % (11/17 1400) Arterial Line BP: (105-155)/(53-89) 123/67 mmHg (11/17 0900) FiO2 (%):  [30 %-50 %] 30 % (11/17 1200) Weight:  [86.183 kg (190 lb)-93.8 kg (206 lb 12.7 oz)] 93.8 kg (206 lb 12.7 oz) (11/16 2216)  Physical Examination: NAD post extubation Cognition intact Chest clerar RRR s M NABS No edema  Active Problems:   Acute embolic stroke   Marijuana abuse   Hypokalemia   Acute respiratory failure  Tolerating extubation. Rest of mgmt per Stroke team.  PCCM will sign off. Please call if we can be of further assistance  Billy Fischer, MD ; [facility] [phone].  After 5:30 PM or weekends, call [phone]

## 2013-02-25 DIAGNOSIS — R51 Headache: Secondary | ICD-10-CM | POA: Diagnosis present

## 2013-02-25 DIAGNOSIS — R61 Generalized hyperhidrosis: Secondary | ICD-10-CM | POA: Diagnosis present

## 2013-02-25 DIAGNOSIS — R519 Headache, unspecified: Secondary | ICD-10-CM | POA: Diagnosis present

## 2013-02-25 DIAGNOSIS — I6350 Cerebral infarction due to unspecified occlusion or stenosis of unspecified cerebral artery: Secondary | ICD-10-CM

## 2013-02-25 LAB — HOMOCYSTEINE: Homocysteine: 10.9 umol/L (ref 4.0–15.4)

## 2013-02-25 LAB — SEDIMENTATION RATE: Sed Rate: 11 mm/hr (ref 0–16)

## 2013-02-25 LAB — HIV ANTIBODY (ROUTINE TESTING W REFLEX): HIV: NONREACTIVE

## 2013-02-25 LAB — RPR: RPR Ser Ql: NONREACTIVE

## 2013-02-25 LAB — ANTITHROMBIN III: AntiThromb III Func: 103 % (ref 75–120)

## 2013-02-25 MED ORDER — ACETAMINOPHEN 325 MG PO TABS
650.0000 mg | ORAL_TABLET | Freq: Four times a day (QID) | ORAL | Status: DC | PRN
  Administered 2013-02-25 – 2013-02-26 (×4): 650 mg via ORAL
  Filled 2013-02-25 (×4): qty 2

## 2013-02-25 MED ORDER — CALCIUM CARBONATE ANTACID 500 MG PO CHEW
400.0000 mg | CHEWABLE_TABLET | Freq: Four times a day (QID) | ORAL | Status: DC | PRN
Start: 2013-02-25 — End: 2013-02-26
  Administered 2013-02-25 – 2013-02-26 (×2): 400 mg via ORAL
  Filled 2013-02-25 (×2): qty 2

## 2013-02-25 MED ORDER — SIMVASTATIN 20 MG PO TABS
20.0000 mg | ORAL_TABLET | Freq: Every day | ORAL | Status: DC
  Administered 2013-02-25 – 2013-02-26 (×2): 20 mg via ORAL
  Filled 2013-02-25 (×2): qty 1

## 2013-02-25 NOTE — Progress Notes (Signed)
Stroke Team Progress Note  HISTORY Marcus Hughes is an 27 y.o. male with no known previous medical history who experienced sudden onset of weakness involving his left arm and left leg about 3:00 today 02/23/2013. Patient fell to the ground and describes the fall as tripping over his leg. He noticed difficulty with use of his left extremities prior to falling. He hit his head when he fell but doesn't think he lost consciousness. He presented to [name] where CT scan of his head showed no acute intracranial abnormality. There was an equivocal increase in density of his right MCA, however. He was deemed a candidate for TPA which was administered at [facility] prior to obtaining a CT angiogram of the head and neck, and subsequent transfer to [facility] for further management. 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 NIH stroke score on arrival to [facility] was 12. Formal catheter angiography was performed by Dr. Deveshwar which showed a complete occlusion of the RT MCA at its origin. Complete TICI 3 revascularization was obtained with 9 mg of IATPA and 2 passes with the Solitaire FR retrieval device. He was admitted to the neuro ICU for further evaluation and treatment.  SUBJECTIVE Pt awake in the bed. Explaining to the MD how his stroke started. Wants to know what changes he needs to make in his life as he is scared. His fiance arrived during rounds. Reports night sweats for the past 1 month. No significant change in weight or the way he feels.   OBJECTIVE Most recent Vital Signs: Filed Vitals:   02/25/13 0600 02/25/13 0700 02/25/13 0800 02/25/13 0819  BP: 99/64 114/72 118/72   Pulse:      Temp:    98.7 F (37.1 C)  TempSrc:    Oral  Resp: 16 17 18   Height:      Weight:      SpO2:       CBG (last 3)   Recent Labs  02/23/13 1639  GLUCAP 123*    IV Fluid Intake:   . sodium chloride 75 mL/hr at 02/25/13 0800  . niCARDipine      MEDICATIONS  . aspirin EC  325 mg Oral  Daily  . influenza vac split quadrivalent PF  0.5 mL Intramuscular Tomorrow-1000  . pantoprazole (PROTONIX) IV  40 mg Intravenous QHS   PRN:  acetaminophen, acetaminophen, labetalol, ondansetron (ZOFRAN) IV  Diet:  General thin liquids Activity:  Up in chair, progress activity DVT Prophylaxis:  SCDs   CLINICALLY SIGNIFICANT STUDIES Basic Metabolic Panel:   Recent Labs Lab 02/23/13 1602 02/24/13 0155  NA 137 138  K 3.2* 3.2*  CL 101 107  CO2 24 20  GLUCOSE 125* 87  BUN 17 11  CREATININE 1.29 1.00  CALCIUM 9.7 7.8*   Liver Function Tests:   Recent Labs Lab 02/23/13 1602  AST 47*  ALT 31  ALKPHOS 41  BILITOT 0.6  PROT 7.8  ALBUMIN 4.6   CBC:   Recent Labs Lab 02/23/13 1602 02/24/13 0155  WBC 5.0 6.7  NEUTROABS 2.4 4.0  HGB 14.3 13.0  HCT 39.9 35.6*  MCV 86.9 86.4  PLT 212 192   Coagulation:   Recent Labs Lab 02/23/13 1620  LABPROT 12.8  INR 0.98   Cardiac Enzymes:   Recent Labs Lab 02/23/13 1602  TROPONINI <0.30   Urinalysis:   Recent Labs Lab 02/23/13 1703  COLORURINE YELLOW  LABSPEC 1.025  PHURINE 6.0  GLUCOSEU NEGATIVE    HGBUR NEGATIVE  BILIRUBINUR NEGATIVE  KETONESUR NEGATIVE  PROTEINUR NEGATIVE  UROBILINOGEN 0.2  NITRITE NEGATIVE  LEUKOCYTESUR NEGATIVE   Lipid Panel    Component Value Date/Time   CHOL 166 02/24/2013 0155   TRIG 112 02/24/2013 0155   HDL 39* 02/24/2013 0155   CHOLHDL 4.3 02/24/2013 0155   VLDL 22 02/24/2013 0155   LDLCALC 105* 02/24/2013 0155   HgbA1C  Lab Results  Component Value Date   HGBA1C 5.6 02/24/2013    Urine Drug Screen:     Component Value Date/Time   LABOPIA NONE DETECTED 02/23/2013 1703   COCAINSCRNUR NONE DETECTED 02/23/2013 1703   LABBENZ NONE DETECTED 02/23/2013 1703   AMPHETMU NONE DETECTED 02/23/2013 1703   THCU POSITIVE* 02/23/2013 1703   LABBARB NONE DETECTED 02/23/2013 1703    Alcohol Level:   Recent Labs Lab 02/23/13 1602  ETH <11    CT of the brain   02/23/2013    1. Sub cm high attenuation area in the posterior right putamen, contrast staining versus small petechial hemorrhage. Followup CT should be able to differentiate. 2. No large vessel territory infarct currently apparent.  02/23/2013    Suspected right-sided hyperdense MCA sign with findings suggestive of mild diffuse right-sided cerebral edema with associated minimal (approximately 3 mm) of right to left midline shift. While there is relative preservation of the gray-white junction, these findings are worrisome for an early right MCA distribution infarct. Further evaluation with MRI may be performed as clinically indicated.   CT Angio Head 02/23/2013   Occlusion of the right middle cerebral artery at its origin. Reconstitution 1 cm distal to that.  Mild swelling of the right hemisphere.  No hemorrhage.    CT Angio Neck   02/23/2013      Normal CT angiography of the neck vessels.    CT Cervical Spine 02/23/2013  No fracture or static subluxation of the cervical spine.    Cerebral Angiogram RT CFA approach.  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 S/P complete TICI 3 revascularization with 9 mg of IATPA and 2 passes with the Solitaire FR retrieval device.  MRI of the brain  02/23/2013 1. Moderate size acute right MCA infarct centered at the right basal ganglia and insula / operculum. There is a small volume of petechial hemorrhage, and there is mass effect with 4 mm of leftward midline shift. No malignant hemorrhagic transformation. No ventriculomegaly. 2. No other acute intracranial abnormality.  MRA of the brain  See angio  2D Echocardiogram  EF 50-55% with no source of embolus.   Carotid Doppler  See angio  LE Venous Dopplers   CXR   02/23/2013   Normal exam.   02/23/2013   1. Endotracheal tube in good position. 2. Left lower lobe atelectasis or collapse.    CT Chest W Contrast 02/23/2013  No acute intrathoracic abnormality. Small AP window lymph node measuring approximately 4 mm incidentally  noted.   CT cervical - No fracture or static subluxation of the cervical spine. Scattered shoddy bilateral cervical lymph nodes are individually not enlarged by size criteria. No bulky cervical lymphadenopathy on this noncontrast examination.  EKG  normal sinus rhythm.   Therapy Recommendations   GENERAL EXAM: Patient is in no distress  CARDIOVASCULAR: Regular rate and rhythm, no murmurs, no carotid bruits  NEUROLOGIC: MENTAL STATUS: awake, alert, language fluent, comprehension intact, naming intact; NO EXTINCTION. NO NEGLECT. CRANIAL NERVE: pupils equal and reactive to light, visual fields full to confrontation, extraocular muscles intact, no   nystagmus, facial sensation and strength symmetric, uvula midline, shoulder shrug symmetric, tongue midline. MOTOR: normal bulk and tone, full strength in the RUE, RLE. SUBTLE WEAKNESS IN LUE AND LLE. LUE 5-/5 TRICEPS AND GRIP, LLE HF 4+, DF 5-.  SENSORY: DECR TO LT, TEMP IN LUE AND LLE. COORDINATION: finger-nose-finger SLOW IN LUE.  REFLEXES: deep tendon reflexes present and symmetric; DOWNGOING TOES. GAIT/STATION: narrow based gait   ASSESSMENT Mr. Arland E Hurwitz is a 27 y.o. male presenting with left sided weakness while playing basketball, he fell, ? LOC. Status post IV t-PA 02/23/2013 at 1659. Cerebral angio confrimed complete occlusion of R MCA at the origin. He was revascularized with IA tPA and mechanical thrombectomy with Solitaire. Imaging confirms a right MCA infarct. MRI confirms moderate size right basal ganglia and insula / operculum infarct with a small volume of petechial hemorrhage (non-symptomatic), and there is cerebral edema with mass effect, 4 mm leftward midline shift. Infarct felt to be embolic secondary to unknown source.  On no antithrombotics prior to admission. Now on aspirin 325 mg orally every day for secondary stroke prevention. Patient with resultant VDRF (resolved), sheath(s) (removed), left hemiparesis, dysphagia. Work  up underway.   VDRF post neurointervention, extubated 02/24/2013, CCM signed off Hyperlipidemia, LDL 105, on no statin PTA, now on no statin, goal LDL < 100 (< 70 for diabetics)  Hypokalemia, 3.2 replaced by eLink  Hx Cocaine use  Marijuana use, positive on admission  etoh use  CT chest with borderline lymph nodes  Night sweats  MilitaryOut of country service  Reported headache prior to stroke event which is unusual for him  Hospital day # 2  TREATMENT/PLAN  Continue aspirin 325 mg orally every day for secondary stroke prevention   Transfer to the floor  OOB. Therapy evals  Check LE dopplers for source of stroke  Check Hypercoagulable panel (minus factor 5 leiden and beta-2-glycoprotein as these test for venous clots) and vasculitic labs (C3, C4, CH50, ANA, ESR) HIV and RPR   Sickle cell screen  Add statin  TEE to look for embolic source. Arranged with [facility] for tomorrow.  If positive for PFO (patent foramen ovale), check bilateral lower extremity venous dopplers to rule out DVT as possible source of stroke.   quantiferon to assess for TB  Tylenol for headache  Consider malignancy workup given 1 month history of night sweats and borderline lymph nodes, as potential source of stroke  SHARON BIBY, MSN, RN, ANVP-BC, ANP-BC, GNP-BC [facility] Pager: [phone] 02/25/2013 9:15 AM  I evaluated and examined patient, reviewed records, labs and imaging, and agree with note and plan. Will complete stroke workup (TEE, hypercoag panel, hemoglobinopathy) and consider malignancy workup given his 1 month history of night sweats and borderline lymph nodes.  VIKRAM R. PENUMALLI, MD 02/25/2013, 10:01 AM Certified in Neurology, Neurophysiology and Neuroimaging [facility] - Stroke Team  Please refer to [email or web address] for on-call Stroke MD  

## 2013-02-25 NOTE — Progress Notes (Signed)
Pt stated he does eat pork products.

## 2013-02-25 NOTE — Evaluation (Signed)
Physical Therapy Evaluation Patient Details Name: Marcus Hughes MRN: [id number] DOB: Oct 29, 1985 Today's Date: 02/25/2013 Time: 4098-1191 PT Time Calculation (min): 39 min  PT Assessment / Plan / Recommendation History of Present Illness  Pt admitted with L hemi paresis and aphasia found to have Rt MCA CVA  Clinical Impression  Pt with minimal residual deficits. Pt functioning near baseline presenting with L hand numbness. Suspect pt to be safe for d/c home once medically stable. Acute PT to follow to progress higher level balance to achieve safe mod I function.    PT Assessment  Patient needs continued PT services    Follow Up Recommendations  No PT follow up    Does the patient have the potential to tolerate intense rehabilitation      Barriers to Discharge        Equipment Recommendations  None recommended by PT    Recommendations for Other Services     Frequency Min 1X/week    Precautions / Restrictions Precautions Precautions: None Restrictions Weight Bearing Restrictions: No   Pertinent Vitals/Pain 6/10 headache. Occipital pressure and behind R eye, Neuro team notified.      Mobility  Bed Mobility Bed Mobility: Supine to Sit;Sitting - Scoot to Edge of Bed Supine to Sit: 6: Modified independent (Device/Increase time) Sitting - Scoot to Edge of Bed: 6: Modified independent (Device/Increase time) Transfers Transfers: Sit to Stand;Stand to Sit Sit to Stand: 5: Supervision Stand to Sit: 6: Modified independent (Device/Increase time) Details for Transfer Assistance: supervision due to first time up Ambulation/Gait Ambulation/Gait Assistance: 5: Supervision Ambulation Distance (Feet): 300 Feet Assistive device: None Ambulation/Gait Assistance Details: no episodes of LOB Gait Pattern: Within Functional Limits Gait velocity: wfl Stairs: Yes Stairs Assistance: 5: Supervision Stairs Assistance Details (indicate cue type and reason): used R hand rail,  reciprocal Stair Management Technique: One rail Right;Alternating pattern Number of Stairs: 24 Modified Rankin (Stroke Patients Only) Pre-Morbid Rankin Score: No symptoms Modified Rankin: No significant disability    Exercises     PT Diagnosis: Difficulty walking  PT Problem List: Impaired sensation PT Treatment Interventions: Functional mobility training;Balance training     PT Goals(Current goals can be found in the care plan section) Acute Rehab PT Goals Patient Stated Goal: to figure out why this happened PT Goal Formulation: With patient Time For Goal Achievement: 03/04/13 Potential to Achieve Goals: Good Additional Goals Additional Goal #1: Pt to achieve >52 on Berg Balance to indicate minimal falls risk.  Visit Information  Last PT Received On: 02/25/13 Assistance Needed: +1 History of Present Illness: Pt admitted with L hemi paresis and aphasia found to have Rt MCA CVA       Prior Functioning  Home Living Family/patient expects to be discharged to:: Private residence Living Arrangements: Spouse/significant other Available Help at Discharge: Family;Available PRN/intermittently Type of Home: Apartment Home Access: Stairs to enter Entrance Stairs-Number of Steps: 3 flights of stairs Entrance Stairs-Rails: Right Home Layout: One level Home Equipment: None Prior Function Level of Independence: Independent Communication Communication: No difficulties Dominant Hand: Right    Cognition  Cognition Arousal/Alertness: Awake/alert Behavior During Therapy: WFL for tasks assessed/performed Overall Cognitive Status: Within Functional Limits for tasks assessed    Extremity/Trunk Assessment Upper Extremity Assessment Upper Extremity Assessment: LUE deficits/detail LUE Sensation: decreased light touch (20% impairment) Lower Extremity Assessment Lower Extremity Assessment: Overall WFL for tasks assessed Cervical / Trunk Assessment Cervical / Trunk Assessment: Normal    Balance    End of Session PT - End of  Session Equipment Utilized During Treatment: Gait belt Activity Tolerance: Patient tolerated treatment well Patient left: in chair;with call bell/phone within reach;with family/visitor present Nurse Communication: Mobility status  GP     Marcene Brawn 02/25/2013, 10:37 AM  Lewis Shock, PT, DPT Pager #: [phone] Office #: [phone]

## 2013-02-25 NOTE — Progress Notes (Signed)
TEE scheduled for [place] tomorrow at 10:30am, orders in. Jacquetta Polhamus PA-C

## 2013-02-25 NOTE — Evaluation (Signed)
Speech Language Pathology Evaluation Patient Details Name: Marcus Hughes MRN: [id number] DOB: [birth year] Today's Date: 02/25/2013 Time: 7829-5621 SLP Time Calculation (min): 20 min  Problem List:  Patient Active Problem List   Diagnosis Date Noted  . Night sweats 02/25/2013  . Headache(784.0) 02/25/2013  . Acute embolic stroke 02/24/2013  . Marijuana abuse 02/24/2013  . Hypokalemia 02/24/2013   Past Medical History: History reviewed. No pertinent past medical history. Past Surgical History:  Past Surgical History  Procedure Laterality Date  . Radiology with anesthesia N/A 02/23/2013    Procedure: RADIOLOGY WITH ANESTHESIA;  Surgeon: Oneal Grout, MD;  Location: [facility] OR;  Service: Radiology;  Laterality: N/A;   HPI:  Marcus Hughes is an 27 y.o. male with no known previous medical history who experienced sudden onset of weakness involving his left arm and left leg about 3:00 today 02/23/2013. Patient fell to the ground and describes the fall as tripping over his leg. He noticed difficulty with use of his left extremities prior to falling. He hit his head when he fell but doesn't think he lost consciousness. He presented to [facility] where CT scan of his head showed no acute intracranial abnormality. There was an equivocal increase in density of his right MCA, however. He was deemed a candidate for TPA which was administered at [facility] prior to obtaining a CT angiogram of the head and neck, and subsequent transfer to [facility] for further management. 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 NIH stroke score on arrival to [facility] was 12. Formal catheter angiography was performed by Dr. Corliss Skains which showed a complete occlusion of the RT MCA at its origin. Complete TICI 3 revascularization was obtained with 9 mg of IATPA and 2 passes with the Solitaire FR retrieval device. He was admitted to the neuro ICU for further evaluation and treatment.   Assessment / Plan /  Recommendation Clinical Impression  Pt demonstrates high level cognitive function WNL. No SLP f/u needed, education complete, will sign off.     SLP Assessment  Patient does not need any further Speech Lanaguage Pathology Services    Follow Up Recommendations       Frequency and Duration        Pertinent Vitals/Pain NA   SLP Goals     SLP Evaluation Prior Functioning  Cognitive/Linguistic Baseline: Within functional limits Vocation: Full time employment   Cognition  Overall Cognitive Status: Within Functional Limits for tasks assessed Arousal/Alertness: Awake/alert Orientation Level: Oriented X4 Attention: Alternating Alternating Attention: Appears intact Memory: Appears intact Awareness: Appears intact Problem Solving: Appears intact Executive Function:  (WNL) Safety/Judgment: Appears intact    Comprehension  Auditory Comprehension Overall Auditory Comprehension: Appears within functional limits for tasks assessed Reading Comprehension Reading Status: Within funtional limits    Expression Verbal Expression Overall Verbal Expression: Appears within functional limits for tasks assessed Written Expression Dominant Hand: Right   Oral / Motor Oral Motor/Sensory Function Overall Oral Motor/Sensory Function: Appears within functional limits for tasks assessed Motor Speech Overall Motor Speech: Appears within functional limits for tasks assessed   GO    Harlon Ditty, MA CCC-SLP [phone]  Claudine Mouton 02/25/2013, 9:47 AM

## 2013-02-25 NOTE — Progress Notes (Signed)
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 Sheaths out yesterday. Pt very much awake and alert, talkative, offers no c/o  Objective: Physical Exam: BP 127/82  Pulse 67  Temp(Src) 98.7 F (37.1 C) (Oral)  Resp 25  Ht 5\' 11"  (1.803 m)  Wt 206 lb 12.7 oz (93.8 kg)  BMI 28.85 kg/m2  SpO2 100% Awake and alert, oriented X 3 Speech fluent PERRLA, EOMI Face/tongue symmetric, no deficits Right Upper and lower Ext strength full/intact Mild weakness on (L)UE. Slightly slow/deliberate finger to nose on left No gait defect when pt walking with PT (B)groins dressings dry, clean. No hematoma Feet warm, good distal pulses   Labs: CBC  Recent Labs  02/23/13 1602 02/24/13 0155  WBC 5.0 6.7  HGB 14.3 13.0  HCT 39.9 35.6*  PLT 212 192   BMET  Recent Labs  02/23/13 1602 02/24/13 0155  NA 137 138  K 3.2* 3.2*  CL 101 107  CO2 24 20  GLUCOSE 125* 87  BUN 17 11  CREATININE 1.29 1.00  CALCIUM 9.7 7.8*   LFT  Recent Labs  02/23/13 1602  PROT 7.8  ALBUMIN 4.6  AST 47*  ALT 31  ALKPHOS 41  BILITOT 0.6   PT/INR  Recent Labs  02/23/13 1620  LABPROT 12.8  INR 0.98     Studies/Results: Ct Angio Head W/cm &/or Wo Cm  02/23/2013   CLINICAL DATA:  Left-sided weakness.  Fall.  Abnormal head CT.  EXAM: CT ANGIOGRAPHY HEAD AND NECK  TECHNIQUE: Multidetector CT imaging of the head and neck was performed using the standard protocol during bolus administration of intravenous contrast. Multiplanar CT image reconstructions including MIPs were obtained to evaluate the vascular anatomy. Carotid stenosis measurements (when applicable) are obtained utilizing NASCET criteria, using the distal internal carotid diameter as the denominator.  CONTRAST:  OMNIPAQUE IOHEXOL 350 MG/ML SOLN  COMPARISON:  Head CT same day  FINDINGS: CTA HEAD FINDINGS  Both internal carotid arteries are patent through the skullbase. Both carotid siphon regions appear normal. On the  left, the anterior and middle cerebral vessels are normal without proximal stenosis, aneurysm or vascular malformation. On the right, there is occlusion of the middle cerebral artery at its origin with reconstitution after 1 cm.  Both vertebral arteries are patent to the basilar. No basilar stenosis. Posterior circulation branch vessels are patent.  There is mild swelling of the right hemisphere.  No hemorrhage.  Review of the MIP images confirms the above findings.  CTA NECK FINDINGS  Lung apices are clear. Branching pattern of the brachiocephalic vessels from the arch is normal. No origin stenoses. Both common carotid arteries are widely patent to their respective bifurcation. Both carotid bifurcations are normal. Both cervical internal carotid arteries are normal.  Both vertebral arteries are patent and normal. The right vertebral artery is small. Left vertebral artery is dominant. No vertebral disease.  Review of the MIP images confirms the above findings.  IMPRESSION: Normal CT angiography of the neck vessels.  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 Reconstitution 1 cm distal to that.  Mild swelling of the right hemisphere.  No hemorrhage.   Electronically Signed   By: Paulina Fusi M.D.   On: 02/23/2013 18:43   Dg Chest 1 View  02/23/2013   CLINICAL DATA:  Head trauma secondary to a fall today.  EXAM: CHEST - 1 VIEW  COMPARISON:  None.  FINDINGS: The heart size and mediastinal contours  are within normal limits. Both lungs are clear. The visualized skeletal structures are unremarkable.  IMPRESSION: Normal exam.   Electronically Signed   By: Geanie Cooley M.D.   On: 02/23/2013 15:58   Ct Head Wo Contrast  02/23/2013   CLINICAL DATA:  CVA.  EXAM: CT HEAD WITHOUT CONTRAST  TECHNIQUE: Contiguous axial images were obtained from the base of the skull through the vertex without intravenous contrast.  COMPARISON:  02/23/2013 at 5:36 p.m.  FINDINGS: Interval intubation with retention of fluid  in the nasopharynx.  There is a sub cm area of high attenuation in the posterior aspect of the right putamen. Sulci of the right cerebral hemisphere appear slightly more effaced than on the left, suggesting edema. Slightly prominent vessels in the right sylvian fissure, likely intravascular enhancement in the setting of recent infarction. No hydrocephalus.  These results were called by telephone at the time of interpretation on 02/23/2013 at 10:42 PM to Dr. Pincus Large, who verbally acknowledged these results.  IMPRESSION: 1. Sub cm high attenuation area in the posterior right putamen, contrast staining versus small petechial hemorrhage. Followup CT should be able to differentiate. 2. No large vessel territory infarct currently apparent.   Electronically Signed   By: Tiburcio Pea M.D.   On: 02/23/2013 22:47   Ct Head Wo Contrast  02/23/2013   CLINICAL DATA:  Post fall, now with altered level of consciousness and left-sided weakness  EXAM: CT HEAD WITHOUT CONTRAST  CT CERVICAL SPINE WITHOUT CONTRAST  TECHNIQUE: Multidetector CT imaging of the head and cervical spine was performed following the standard protocol without intravenous contrast. Multiplanar CT image reconstructions of the cervical spine were also generated.  COMPARISON:  None.  FINDINGS: CT HEAD FINDINGS  The examination is minimally degraded due to patient's head being held in a minimal degree of obliquity.  There is apparent asymmetric increased attenuation of the right MCA. This finding is without associated discrete blurring of the right-side gray-white differentiation although there does appear to be minimal (approximately 3 mm) of right to left midline shift (image 14, series 2) which may suggest mild diffuse right-sided cerebral edema.  No intraparenchymal or extra-axial mass or hemorrhage. There is very minimal effacement of the right lateral ventricle with otherwise normal size and configuration of the ventricles and basilar cisterns.   Limited visualization of the paranasal sinuses demonstrates polypoid mucosal thickening involving the right posterior ethmoidal air cells. No air-fluid levels. Regional soft tissues are normal. No displaced calvarial fracture.  CT CERVICAL SPINE FINDINGS  C1 to the superior endplate of T1 is image.  Normal alignment of the cervical spine. No anterolisthesis or retrolisthesis. The bilateral facets are normally aligned. The dens is normally positioned between the lateral mass of C1. Normal atlantodental and atlantoaxial articulations.  No fracture or static subluxation of the cervical spine. Cervical vertebral body heights are preserved. Prevertebral soft tissues are normal.  Intervertebral disc spaces are preserved.  Scattered shoddy bilateral cervical lymph nodes are individually not enlarged by size criteria. No bulky cervical lymphadenopathy on this noncontrast examination. Normal noncontrast appearance of the thyroid gland. Normal appearance of the visualized bilateral lung apices.  IMPRESSION: 1. Suspected right-sided hyperdense MCA sign with findings suggestive of mild diffuse right-sided cerebral edema with associated minimal (approximately 3 mm) of right to left midline shift. While there is relative preservation of the gray-white junction, these findings are worrisome for an early right MCA distribution infarct. Further evaluation with MRI may be performed as clinically indicated. 2. No fracture  or static subluxation of the cervical spine.  Critical Value/emergent results were called by telephone at the time of interpretation on 02/23/2013 at 4:06 PM to Dr.STEPHEN RANCOUR , who verbally acknowledged these results.   Electronically Signed   By: Simonne Come M.D.   On: 02/23/2013 16:14   Ct Angio Neck W/cm &/or Wo/cm  02/23/2013   CLINICAL DATA:  Left-sided weakness.  Fall.  Abnormal head CT.  EXAM: CT ANGIOGRAPHY HEAD AND NECK  TECHNIQUE: Multidetector CT imaging of the head and neck was performed using  the standard protocol during bolus administration of intravenous contrast. Multiplanar CT image reconstructions including MIPs were obtained to evaluate the vascular anatomy. Carotid stenosis measurements (when applicable) are obtained utilizing NASCET criteria, using the distal internal carotid diameter as the denominator.  CONTRAST:  OMNIPAQUE IOHEXOL 350 MG/ML SOLN  COMPARISON:  Head CT same day  FINDINGS: CTA HEAD FINDINGS  Both internal carotid arteries are patent through the skullbase. Both carotid siphon regions appear normal. On the left, the anterior and middle cerebral vessels are normal without proximal stenosis, aneurysm or vascular malformation. On the right, there is occlusion of the middle cerebral artery at its origin with reconstitution after 1 cm.  Both vertebral arteries are patent to the basilar. No basilar stenosis. Posterior circulation branch vessels are patent.  There is mild swelling of the right hemisphere.  No hemorrhage.  Review of the MIP images confirms the above findings.  CTA NECK FINDINGS  Lung apices are clear. Branching pattern of the brachiocephalic vessels from the arch is normal. No origin stenoses. Both common carotid arteries are widely patent to their respective bifurcation. Both carotid bifurcations are normal. Both cervical internal carotid arteries are normal.  Both vertebral arteries are patent and normal. The right vertebral artery is small. Left vertebral artery is dominant. No vertebral disease.  Review of the MIP images confirms the above findings.  IMPRESSION: Normal CT angiography of the neck vessels.  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 Reconstitution 1 cm distal to that.  Mild swelling of the right hemisphere.  No hemorrhage.   Electronically Signed   By: Paulina Fusi M.D.   On: 02/23/2013 18:43   Ct Chest W Contrast  02/23/2013   CLINICAL DATA:  Fall, altered level of consciousness  EXAM: CT CHEST WITH CONTRAST  TECHNIQUE:  Multidetector CT imaging of the chest was performed during intravenous contrast administration.  CONTRAST:  OMNIPAQUE IOHEXOL 350 MG/ML SOLN  COMPARISON:  CT head and neck are dictated separately under a dedicated report  FINDINGS: Heart size at upper limits of normal. No pericardial or pleural effusion. Small AP window lymph node measuring approximately 4 mm incidentally noted. Symmetric bilateral retroareolar soft tissue density may represent gynecomastia, which could be asymptomatic could streak artifact from the patient's arms overlies the thorax.  Motion artifact degrades imaging at multiple levels. Allowing for this, the lungs are clear. No pneumothorax. Central airways are patent. No acute osseous abnormality.  IMPRESSION: No acute intrathoracic abnormality.   Electronically Signed   By: Christiana Pellant M.D.   On: 02/23/2013 17:47   Ct Cervical Spine Wo Contrast  02/23/2013   CLINICAL DATA:  Post fall, now with altered level of consciousness and left-sided weakness  EXAM: CT HEAD WITHOUT CONTRAST  CT CERVICAL SPINE WITHOUT CONTRAST  TECHNIQUE: Multidetector CT imaging of the head and cervical spine was performed following the standard protocol without intravenous contrast. Multiplanar CT image reconstructions of the cervical  spine were also generated.  COMPARISON:  None.  FINDINGS: CT HEAD FINDINGS  The examination is minimally degraded due to patient's head being held in a minimal degree of obliquity.  There is apparent asymmetric increased attenuation of the right MCA. This finding is without associated discrete blurring of the right-side gray-white differentiation although there does appear to be minimal (approximately 3 mm) of right to left midline shift (image 14, series 2) which may suggest mild diffuse right-sided cerebral edema.  No intraparenchymal or extra-axial mass or hemorrhage. There is very minimal effacement of the right lateral ventricle with otherwise normal size and configuration  of the ventricles and basilar cisterns.  Limited visualization of the paranasal sinuses demonstrates polypoid mucosal thickening involving the right posterior ethmoidal air cells. No air-fluid levels. Regional soft tissues are normal. No displaced calvarial fracture.  CT CERVICAL SPINE FINDINGS  C1 to the superior endplate of T1 is image.  Normal alignment of the cervical spine. No anterolisthesis or retrolisthesis. The bilateral facets are normally aligned. The dens is normally positioned between the lateral mass of C1. Normal atlantodental and atlantoaxial articulations.  No fracture or static subluxation of the cervical spine. Cervical vertebral body heights are preserved. Prevertebral soft tissues are normal.  Intervertebral disc spaces are preserved.  Scattered shoddy bilateral cervical lymph nodes are individually not enlarged by size criteria. No bulky cervical lymphadenopathy on this noncontrast examination. Normal noncontrast appearance of the thyroid gland. Normal appearance of the visualized bilateral lung apices.  IMPRESSION: 1. Suspected right-sided hyperdense MCA sign with findings suggestive of mild diffuse right-sided cerebral edema with associated minimal (approximately 3 mm) of right to left midline shift. While there is relative preservation of the gray-white junction, these findings are worrisome for an early right MCA distribution infarct. Further evaluation with MRI may be performed as clinically indicated. 2. No fracture or static subluxation of the cervical spine.  Critical Value/emergent results were called by telephone at the time of interpretation on 02/23/2013 at 4:06 PM to Dr.STEPHEN RANCOUR , who verbally acknowledged these results.   Electronically Signed   By: Simonne Come M.D.   On: 02/23/2013 16:14   Mr Brain Wo Contrast  02/24/2013   CLINICAL DATA:  27 year old male with sudden onset left extremity weakness with subsequent fall. Initial encounter. Right MCA occlusion detected on  02/23/2013. Status post tPA.  EXAM: MRI HEAD WITHOUT CONTRAST  TECHNIQUE: Multiplanar, multiecho pulse sequences of the brain and surrounding structures were obtained without intravenous contrast.  COMPARISON:  CTA head and neck 02/23/2013.  FINDINGS: Moderate size confluent restricted diffusion in the right MCA territory, extending from the right basal ganglia through the insula and involving the operculum. Anterior and posterior right MCA divisions are relatively spared. Associated confluent T2 and FLAIR hyperintensity in keeping with cytotoxic edema. There is associated mass effect with some effacement of the right lateral ventricle. There is leftward midline shift of 4 mm. There is petechial hemorrhage in the right lentiform nuclei (series 7, image 12). .  No left hemisphere or posterior fossa restricted diffusion. Major intracranial vascular flow voids are preserved. Dominant left vertebral artery again noted.  No ventriculomegaly. Basilar cisterns are patent. Outside of the right MCA territory, Wallace Cullens and white matter signal is within normal limits throughout the brain. Negative pituitary, cervicomedullary junction and visualized cervical spine.  Trace mastoid fluid. Negative nasopharynx. Right ethmoid air cell mucous seal suspected. Trace paranasal sinus mucosal thickening elsewhere. Visualized orbit soft tissues are within normal limits. Visualized scalp soft tissues  are within normal limits. Normal bone marrow signal.  IMPRESSION: 1. Moderate size acute right MCA infarct centered at the right basal ganglia and insula / operculum. There is a small volume of petechial hemorrhage, and there is mass effect with 4 mm of leftward midline shift. No malignant hemorrhagic transformation. No ventriculomegaly.  2. No other acute intracranial abnormality.   Electronically Signed   By: Augusto Gamble M.D.   On: 02/24/2013 18:32   Dg Chest Port 1 View  02/23/2013   CLINICAL DATA:  Stroke and respiratory failure  EXAM:  PORTABLE CHEST - 1 VIEW  COMPARISON:  02/23/2013 at 3:49 p.m.  FINDINGS: Endotracheal tube ends in the lower thoracic trachea, approximately 3 cm above the carina.  Heart size within normal limits for technique. New retrocardiac opacity, obscuring the diaphragm, with subtle left-sided volume loss. No edema, effusion, or pneumothorax.  Evidence of prior surgery to the right anterior glenoid.  IMPRESSION: 1. Endotracheal tube in good position. 2. Left lower lobe atelectasis or collapse.   Electronically Signed   By: Tiburcio Pea M.D.   On: 02/23/2013 23:22   Ir Angio Intra Extracran Sel Com Carotid Innominate Uni L Mod Sed  02/24/2013   CLINICAL DATA:  Acute onset of dysarthria left-sided weakness and visual field deficit. Abnormal CT angiogram of the brain.  EXAM: Bilateral common carotid arteriograms, and bilateral vertebrae angiograms followed by endovascular revascularization of the occluded right middle cerebral artery using supra selective intra-arterial tPA and mechanical retrieval ldevice.  MEDICATIONS: As per anesthesia service.  ANESTHESIA/SEDATION: General anesthesia.  CONTRAST:  OMNIPAQUE IOHEXOL 300 MG/ML  SOLN  PROCEDURE: Following full explanation of the procedure along with the potential associated complications, informed witnessed consent was obtained.  The right renal was prepped and draped in the usual sterile fashion. Thereafter using a modified Seldinger technique, transfemoral access to right common femoral artery was obtained without difficulty. Over a 0.035 inch guidewire, 5 [place] pinnacle sheath was inserted. Through this, and also over a 0.035 inch guidewire, a 5 [place] JB 1 catheter was advanced to the auricular region and slightly positioned in the right vertebral artery, the right common carotid artery, the left common carotid artery and left subclavian artery  There were no acute complications.  The patient tolerated the procedure well.  COMPLICATIONS: None immediate.   FINDINGS: The right vertebral artery origin is normal  The vessel opacifies normally to the brain skull base.  There is normal opacification of the right posterior-inferior cerebellar artery. No distal flow is seen into the right vertebral basilar junction.  The right common carotid arteriogram demonstrates right external carotid artery in its major branch to be normal  The right internal carotid artery at the bulb. Brain skull base opacifies normally  The petrous, cavernous and minor supra clinoid segments are widely patent. A right posterior communicating artery seen opacified right posterior cerebral artery distribution  There is complete occlusion of the right middle cerebral artery at its origin. Extensive lenticulostriate vessels are seen emanating from the anterior cerebral artery proximally and the right internal carotid artery terminus  The right anterior cerebral artery CT opacify normally into the capillary and venous phases  The delayed arterial phase demonstrates large avascular hypoperfused. Involving the mid to posterior frontal and the parietal cortical subcortical areas  The mild CAM type leptomeningeal collaterals station retrogradely seen from the callosum marginal and the pericallosal branches.  The left common carotid arteriogram demonstrates a left eye external carotid artery and its major branches to  be normal  The left internal carotid artery at the bulb her brain skull was opacifies normally  The petrous, cavernous and the supra clinoid segment RCT opacified normally  The left mid lung the left anterior cerebral arteries opacified normally into the capillary and venous phases  L left posterior communicating artery seen opacifying the left posterior cerebral artery distribution  The left vertebral artery origin is normal  The vessel opacifies normally to the brain skull base.  There is normal opacification of a left posterior inferior cerebellar artery the left vertebral junction  The basilar  artery, the posterior cerebral arteries, is apparent cerebellar arteries supplying the anterior inferior cerebellar arteries opacify normally into the capillary and venous phases  Retrograde opacification of the right posterior communicating artery seen.  Endovascular complete revascularization of occluded right middle cerebral artery using 9 mg of super selective intracranial intra-arterial tPA, and 2 passes with the solitaire stent retrieval device.  The angiographic findings were reviewed with the referring neurologist and the patient's family  The option of l endovascular revascularization of occluded right middle cerebral artery to prevent further neurological injury, and. to potentially improve subsequent recovery, was discussed with the patient's family  Potential risks of intracranial hemorrhage of 10-15%, worsening neurological function, ventilator dependency and inability to Re vascularise were all discussed with the patient's family.  After informed consent, patient was placed under general anesthesia by [facility]  The diagnostic JB 1 catheter and right common carotid artery was then exchanged over a 0.035 inch through her cm arising seen guidewire for a 8 French 55 cm right. Neurovascular sheath using biplane roadmap technique in constant fluoroscopic guidance. Needle aspiration was obtained from the side port of the neurovascular sheath. A gentle contrast injection through a side-port demonstrate no evidence of spasms dissections or for puncture normal filling defects  This was then connected to continuous heparinized saline infusion  Over the [employer] guidewire, an 8 [place] mic 5 cm Marcy balloon guide catheter which of  Comparison with 7th 5% contrast and 45% heparinized saline infusion, was advanced and positioned just proximal to the right common carotid bifurcation. The guidewire is moot. Good aspiration was obtained from the head of the 8 [place] mars guide  catheter  Vaginal consultation demonstrate no evidence of spasms dissections are all neck along filling defects  Over a 0.035 inch Rosen exchange guidewire, the 8 [place] Merci guide catheter was then advanced using biplane roadmap technique and constant fluoroscopic guidance to the distal vertical segment of the right internal carotid artery. The guidewire is moved. Good aspiration was obtained from the hub of the 8 [place] Merci guide catheter. A gentle contrast injection demonstrated mild spasm at the tip of the Merci guide catheter in the right internal carotid artery, which responded to to 25 mi intra-arterial nitroglycerin through the guide catheter.  A controlled informed seen intracranially demonstrate no change on the occluded right middle cerebral artery proximally  At this time and a coaxial manner and with constant heparinized saline perfusion, using bilateral roadmap technique and constant fluoroscopic guidance, a combination of from an 18 L Marcy micro catheter within a DAC 125 marked catheter was advanced over a 0.01 following and soft the upper sacrum guidewire through the distally and of the 8 [place] Mercy on catheter and right internal carotid artery. With the bilaterally with a JT valve the right shin cerebral dissections or induced spasm, the combination was then again with a tall device to the supra right  ICA. The micro guide was then gently nipple a using internal advised and advanced into the right middle cerebral artery inferior division and the and 3 region followed by the marked catheter combination  The micro guide was then gently removed. Slowly aspirate was obtained from the above the micro catheter  The spinal the use of 5 mg of super selective intra-arterial intracranial of t-PA and 5 mL of normal saline  The its was infused over 5 min with the tip of my catheter being retrieved gently proximally to engage more of the bulk of the clot. At the end of this, aspiration was obtained from the  hub of the micro catheter  A 4 mm x 20 mm solitaire eft for stent vertebral device was then prepped with heparinized saline perfusion and its has an  This to then advanced in a coaxial manner and with constant heparinized saline fusion the distal and of the micro catheter  The overlying is only delivery 5 guidewire, and the liver marked catheter and then gently loosen. Was 5 overt gentle traction with the right hand only delivery marked guidewire, the delivery might catheter was then and she is using the left hand until the proximal portion of the stent arterial device had been deployed.  The stent was left nipple for 3 min. At the end of this, the proximal portion of the stent was CT changes the tip of my catheter  The dominant for proximal Florastor then inflated in the right internal carotid artery. With constant aspiration using a 60 mL syringe only side port of the 8 [place] guide catheter, the combination of the retrieval device, the micro catheter with the a DAC 125 mic catheter was retrieved and removed with constant aspiration in  Progress. Small chunks of clot were seen adherent to the arterial device. The occlusion balloon was deflated. Back bleed was allowed.  A controlled be performed through the 8 [place] Merci guide catheter demonstrated continued occlusion of the right middle cerebral artery proximally. This prompted a 2nd cancer with the 18 L micro catheter and the advanced over a 0.01 for itself hip sacrum I guidewire this time into the superior division of the right middle cerebral artery. With the micro catheter in the M 2 and 3 region, general consultation demonstrated antegrade flow into the distal cortical branches  The micro catheter was then gently retrieved proximally while still an additional 4 mg of 5 super selective intra-arterial tPA was infused over approximately 4 min and 4 cc of normal saline.  The an of this, of the [place] at for a stent retrieval device with advanced on a course a  man with constant heparinized saline fusion using biplane roadmap technique in consequence of guidance and positioned Nedra Hai as mentioned earlier across the occluded right middle cerebral artery M1 branch and the proximal M2 region of the superior division. All stent was left and deployed for 3 min. Again this common the proximal portion of the stent was catcher's to the micro catheter. With constant aspiration via side-port at the help of the IA [facility] guide catheter, and the balloon inflated in the right internal carotid artery for proximal flow arrest, the combination of the retrieval device and the micro catheter was then and retrieved and removed.  Examination of the aspirate demonstrated the presence of a 3 mm by 4 mm clot. The occlusion balloon was then deflated. Back bleed was allowed.  A controls injection performed through the 8 [place] a Merci guide catheter demonstrates  complete angiographic revascularization of the right middle cerebral artery the right anterior cerebral artery without evidence of any filling defects.  Proximal spasm noted in the right internal carotid artery responded to intra-arterial nitroglycerin.  The 8 French pressure guide catheter was then retrieved into the right common carotid artery. A control arteriogram performed centered intracranially demonstrate complete angiographic a three-vessel a shin of the right middle cerebral artery distribution.  No evidence of intraluminal filling defects were seen.  There was no evidence of mass effect or of extravasation of contrast  Patient's hemodynamic and neurologic status remained stable throughout her procedure  8 [place] MRSA catheter and the 8 [place] and a rest she has been treated into the abdominal aorta an exchange ear age 80 guidewire for a new 9 [place] pinnacle sheath. This was then connected to continuous heparinized saline perfusion  The patient then transferred to the CT scan for postprocedural CT scan of the brain.    Electronically Signed   By: Julieanne Cotton M.D.   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 ASA 325 daily Other plans per Neuro Will report to Dr. Corliss Skains    LOS: 2 days    Brayton El PA-C 02/25/2013 10:09 AM

## 2013-02-25 NOTE — Progress Notes (Signed)
*  PRELIMINARY RESULTS* Vascular Ultrasound Lower extremity venous duplex has been completed.  Preliminary findings: no evidence of DVT   Farrel Demark, RDMS, RVT  02/25/2013, 11:29 AM

## 2013-02-26 ENCOUNTER — Encounter (HOSPITAL_COMMUNITY)
Admission: EM | Disposition: A | Discharge status: Home / Self Care | Payer: Self-pay | Source: Home / Self Care | Attending: Neurology

## 2013-02-26 DIAGNOSIS — I6789 Other cerebrovascular disease: Secondary | ICD-10-CM

## 2013-02-26 DIAGNOSIS — I63511 Cerebral infarction due to unspecified occlusion or stenosis of right middle cerebral artery: Secondary | ICD-10-CM

## 2013-02-26 DIAGNOSIS — H53149 Visual discomfort, unspecified: Secondary | ICD-10-CM

## 2013-02-26 DIAGNOSIS — Z79899 Other long term (current) drug therapy: Secondary | ICD-10-CM

## 2013-02-26 DIAGNOSIS — E785 Hyperlipidemia, unspecified: Secondary | ICD-10-CM

## 2013-02-26 HISTORY — PX: TEE WITHOUT CARDIOVERSION: SHX5443

## 2013-02-26 LAB — PROTEIN S ACTIVITY: Protein S Activity: 80 % (ref 69–129)

## 2013-02-26 LAB — SICKLE CELL SCREEN: Sickle Cell Screen: NEGATIVE

## 2013-02-26 LAB — PROTHROMBIN GENE MUTATION

## 2013-02-26 LAB — LUPUS ANTICOAGULANT PANEL: PTT Lupus Anticoagulant: 36.3 secs (ref 28.0–43.0)

## 2013-02-26 LAB — PROTEIN C ACTIVITY: Protein C Activity: 130 % (ref 75–133)

## 2013-02-26 SURGERY — ECHOCARDIOGRAM, TRANSESOPHAGEAL
Anesthesia: Moderate Sedation

## 2013-02-26 MED ORDER — HYDROCODONE-ACETAMINOPHEN 5-325 MG PO TABS
1.0000 | ORAL_TABLET | Freq: Once | ORAL | Status: AC
  Administered 2013-02-26: 1 via ORAL
  Filled 2013-02-26: qty 1

## 2013-02-26 MED ORDER — FENTANYL CITRATE 0.05 MG/ML IJ SOLN
INTRAMUSCULAR | Status: DC | PRN
  Administered 2013-02-26 (×4): 25 ug via INTRAVENOUS

## 2013-02-26 MED ORDER — MIDAZOLAM HCL 5 MG/ML IJ SOLN
INTRAMUSCULAR | Status: AC
  Filled 2013-02-26: qty 2

## 2013-02-26 MED ORDER — SIMVASTATIN 20 MG PO TABS: 20.0000 mg | ORAL_TABLET | Freq: Every day | ORAL | Status: DC

## 2013-02-26 MED ORDER — FENTANYL CITRATE 0.05 MG/ML IJ SOLN
INTRAMUSCULAR | Status: AC
  Filled 2013-02-26: qty 2

## 2013-02-26 MED ORDER — ASPIRIN 325 MG PO TBEC: 325.0000 mg | DELAYED_RELEASE_TABLET | Freq: Every day | ORAL | Status: DC

## 2013-02-26 MED ORDER — MIDAZOLAM HCL 10 MG/2ML IJ SOLN
INTRAMUSCULAR | Status: DC | PRN
  Administered 2013-02-26 (×4): 2 mg via INTRAVENOUS

## 2013-02-26 MED ORDER — BUTAMBEN-TETRACAINE-BENZOCAINE 2-2-14 % EX AERO
INHALATION_SPRAY | CUTANEOUS | Status: DC | PRN
  Administered 2013-02-26: 2 via TOPICAL

## 2013-02-26 MED ORDER — TRAMADOL HCL 50 MG PO TABS: 100.0000 mg | ORAL_TABLET | Freq: Four times a day (QID) | ORAL | Status: DC | PRN

## 2013-02-26 MED ORDER — TRAMADOL HCL 50 MG PO TABS
100.0000 mg | ORAL_TABLET | Freq: Four times a day (QID) | ORAL | Status: DC | PRN
  Administered 2013-02-26 (×2): 100 mg via ORAL
  Filled 2013-02-26 (×2): qty 2

## 2013-02-26 NOTE — CV Procedure (Signed)
    Transesophageal Echocardiogram Note  YANNIS BROCE [id number] 05/18/1985  Procedure: Transesophageal Echocardiogram Indications: CVA  Procedure Details Consent: Obtained Time Out: Verified patient identification, verified procedure, site/side was marked, verified correct patient position, special equipment/implants available, Radiology Safety Procedures followed,  medications/allergies/relevent history reviewed, required imaging and test results available.  Performed  Medications: Fentanyl: 100 mcg  Versed: 8 mg iv  Left Ventrical:  Normal LV , normal function  Mitral Valve: trace MR,  MV is structurally normal  Aortic Valve: normal  Tricuspid Valve: normal  Pulmonic Valve: normal  Left Atrium/ Left atrial appendage: clear, no thrombus  Atrial septum: intact.  No ASD or PFO by color Doppler or bubble study  Aorta: normal   Complications: No apparent complications Patient did tolerate procedure well.   Vesta Mixer, Montez Hageman., MD, [facility] 02/26/2013, 11:28 AM

## 2013-02-26 NOTE — Progress Notes (Signed)
Pt. Discharged; given scripts and note for school. Also, given stroke education; pt. Verbalized understanding of calling 911 for further/new symptoms.

## 2013-02-26 NOTE — Progress Notes (Addendum)
Stroke Team Progress Note  HISTORY Marcus Hughes is an 27 y.o. male with no known previous medical history who experienced sudden onset of weakness involving his left arm and left leg about 3:00 today 02/23/2013. Patient fell to the ground and describes the fall as tripping over his leg. He noticed difficulty with use of his left extremities prior to falling. He hit his head when he fell but doesn't think he lost consciousness. He presented to [facility] where CT scan of his head showed no acute intracranial abnormality. There was an equivocal increase in density of his right MCA, however. He was deemed a candidate for TPA which was administered at [facility] prior to obtaining a CT angiogram of the head and neck, and subsequent transfer to [facility] for further management. 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 NIH stroke score on arrival to [facility] was 12. Formal catheter angiography was performed by Dr. Corliss Skains which showed a complete occlusion of the RT MCA at its origin. Complete TICI 3 revascularization was obtained with 9 mg of IATPA and 2 passes with the Solitaire FR retrieval device. He was admitted to the neuro ICU for further evaluation and treatment.  SUBJECTIVE Patient just back from TEE. Feels better. Wants to go home.  OBJECTIVE Most recent Vital Signs: Filed Vitals:   02/25/13 1852 02/25/13 2145 02/26/13 0156 02/26/13 1000  BP: 148/97 127/72 133/79   Pulse: 66 79 68   Temp: 98.2 F (36.8 C) 99.4 F (37.4 C) 98.3 F (36.8 C) 98.3 F (36.8 C)  TempSrc: Oral Oral Oral Oral  Resp: 20 20 18    Height:      Weight:      SpO2: 100% 100% 98%    CBG (last 3)   Recent Labs  02/23/13 1639  GLUCAP 123*    IV Fluid Intake:      MEDICATIONS  . aspirin EC  325 mg Oral Daily  . influenza vac split quadrivalent PF  0.5 mL Intramuscular Tomorrow-1000  . simvastatin  20 mg Oral q1800   PRN:  acetaminophen, calcium carbonate, labetalol, traMADol  Diet:  Regular Activity:  Up in chair, progress activity DVT Prophylaxis:  SCDs   CLINICALLY SIGNIFICANT STUDIES Basic Metabolic Panel:   Recent Labs Lab 02/23/13 1602 02/24/13 0155  NA 137 138  K 3.2* 3.2*  CL 101 107  CO2 24 20  GLUCOSE 125* 87  BUN 17 11  CREATININE 1.29 1.00  CALCIUM 9.7 7.8*   Liver Function Tests:   Recent Labs Lab 02/23/13 1602  AST 47*  ALT 31  ALKPHOS 41  BILITOT 0.6  PROT 7.8  ALBUMIN 4.6   CBC:   Recent Labs Lab 02/23/13 1602 02/24/13 0155  WBC 5.0 6.7  NEUTROABS 2.4 4.0  HGB 14.3 13.0  HCT 39.9 35.6*  MCV 86.9 86.4  PLT 212 192   Coagulation:   Recent Labs Lab 02/23/13 1620  LABPROT 12.8  INR 0.98   Cardiac Enzymes:   Recent Labs Lab 02/23/13 1602  TROPONINI <0.30   Urinalysis:   Recent Labs Lab 02/23/13 1703  COLORURINE YELLOW  LABSPEC 1.025  PHURINE 6.0  GLUCOSEU NEGATIVE  HGBUR NEGATIVE  BILIRUBINUR NEGATIVE  KETONESUR NEGATIVE  PROTEINUR NEGATIVE  UROBILINOGEN 0.2  NITRITE NEGATIVE  LEUKOCYTESUR NEGATIVE   Lipid Panel    Component Value Date/Time   CHOL 166 02/24/2013 0155   TRIG 112 02/24/2013 0155   HDL 39* 02/24/2013 0155   CHOLHDL 4.3  02/24/2013 0155   VLDL 22 02/24/2013 0155   LDLCALC 105* 02/24/2013 0155   HgbA1C  Lab Results  Component Value Date   HGBA1C 5.6 02/24/2013    Urine Drug Screen:     Component Value Date/Time   LABOPIA NONE DETECTED 02/23/2013 1703   COCAINSCRNUR NONE DETECTED 02/23/2013 1703   LABBENZ NONE DETECTED 02/23/2013 1703   AMPHETMU NONE DETECTED 02/23/2013 1703   THCU POSITIVE* 02/23/2013 1703   LABBARB NONE DETECTED 02/23/2013 1703    Alcohol Level:   Recent Labs Lab 02/23/13 1602  ETH <11   Hypercoagulable Workup Normal - C3, C4, RPR, ESR, HIV, Anti III, homocysteine Pending - CH50, ANA, Prot C activity, Protein C total, Protein S activity, Protein S total, lupus anticoagulant, cardiolipin antibody, prothrombin gene mutation  Quantiferon  pending  Sickle Cell screen neg  CT of the brain   02/23/2013   1. Sub cm high attenuation area in the posterior right putamen, contrast staining versus small petechial hemorrhage. Followup CT should be able to differentiate. 2. No large vessel territory infarct currently apparent.  02/23/2013    Suspected right-sided hyperdense MCA sign with findings suggestive of mild diffuse right-sided cerebral edema with associated minimal (approximately 3 mm) of right to left midline shift. While there is relative preservation of the gray-white junction, these findings are worrisome for an early right MCA distribution infarct. Further evaluation with MRI may be performed as clinically indicated.   CT Angio Head 02/23/2013   Occlusion of the right middle cerebral artery at its origin. Reconstitution 1 cm distal to that.  Mild swelling of the right hemisphere.  No hemorrhage.    CT Angio Neck   02/23/2013      Normal CT angiography of the neck vessels.    CT Cervical Spine 02/23/2013  No fracture or static subluxation of the cervical spine.    Cerebral Angiogram RT CFA approach.  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 S/P complete TICI 3 revascularization with 9 mg of IATPA and 2 passes with the Solitaire FR retrieval device.  MRI of the brain  02/23/2013 1. Moderate size acute right MCA infarct centered at the right basal ganglia and insula / operculum. There is a small volume of petechial hemorrhage, and there is mass effect with 4 mm of leftward midline shift. No malignant hemorrhagic transformation. No ventriculomegaly. 2. No other acute intracranial abnormality.  MRA of the brain  See angio  2D Echocardiogram  EF 50-55% with no source of embolus.   TEE  No ASD or PFO by color Doppler or bubble study, LAA clear with no thrombus  Carotid Doppler  See angio  LE Venous Dopplers 02/25/2013 no evidence of DVT  CXR   02/23/2013   Normal exam.   02/23/2013   1. Endotracheal tube in good position. 2.  Left lower lobe atelectasis or collapse.    CT Chest W Contrast 02/23/2013  No acute intrathoracic abnormality. Small AP window lymph node measuring approximately 4 mm incidentally noted.   CT cervical - No fracture or static subluxation of the cervical spine. Scattered shoddy bilateral cervical lymph nodes are individually not enlarged by size criteria. No bulky cervical lymphadenopathy on this noncontrast examination.  EKG  normal sinus rhythm.   Therapy Recommendations NO PT, no OT  GENERAL EXAM: Patient is in no distress  CARDIOVASCULAR: Regular rate and rhythm, no murmurs, no carotid bruits  NEUROLOGIC: MENTAL STATUS: awake, alert, language fluent, comprehension intact, naming intact; NO EXTINCTION. NO NEGLECT. CRANIAL  NERVE: pupils equal and reactive to light, visual fields full to confrontation, extraocular muscles intact, no nystagmus, facial sensation and strength symmetric, uvula midline, shoulder shrug symmetric, tongue midline. MOTOR: normal bulk and tone, full strength in the RUE, RLE. SUBTLE WEAKNESS IN LUE AND LLE. LUE 5-/5 TRICEPS AND GRIP, LLE HF 4+, DF 5-.  SENSORY: DECR TO LT, TEMP IN LUE AND LLE. COORDINATION: finger-nose-finger SLOW IN LUE.  REFLEXES: deep tendon reflexes present and symmetric; DOWNGOING TOES. GAIT/STATION: narrow based gait   ASSESSMENT Marcus Hughes is a 27 y.o. male presenting with left sided weakness while playing basketball, he fell, ? LOC. Status post IV t-PA 02/23/2013 at 1659. Cerebral angio confrimed complete occlusion of R MCA at the origin. He was revascularized with IA tPA and mechanical thrombectomy with Solitaire. Imaging confirms a right MCA infarct. MRI confirms moderate size right basal ganglia and insula / operculum infarct with a small volume of petechial hemorrhage (non-symptomatic), and there is cerebral edema with mass effect, 4 mm leftward midline shift. Infarct felt to be embolic secondary to unknown source.  On no  antithrombotics prior to admission. Now on aspirin 325 mg orally every day for secondary stroke prevention. Patient with resultant VDRF (resolved), sheath(s) (removed), left hemiparesis (resolved), dysphagia (resolved).    VDRF post neurointervention, extubated 02/24/2013, CCM signed off Hyperlipidemia, LDL 105, on no statin PTA, now on zocor 20, goal LDL < 100  Hypokalemia, 3.2 replaced   Hx Cocaine use, neg on admission  Marijuana use, positive on admission, cessation counseling.  etoh use  CT chest with borderline lymph nodes. Discussed with patient  Night sweats  Military Out of country service  Reported headache prior to stroke event which is unusual for him. Headache with light sensitivity this am  Hospital day # 3  TREATMENT/PLAN  Continue aspirin 325 mg orally every day for secondary stroke prevention   No therapy needs  F/u Hypercoagulable lab results, so far unrevealing  TEE shows no embolic source. LE dopplers negative for DVT.  F/u quantiferon to assess for TB Outpatient TCD bubble study with emboli monitoring with Dr. Pearlean Brownie in 1 month to further evaluate for possible PFO. Patient to call for appointment. (I have added to discharge instruction sheet). Follow up with Dr. Pearlean Brownie in 2 months in stroke clinic. He has already planned to find outpatient physician and I encouraged him to make appt within next 2 weeks. I have written a note to have him excused from Fall semester college courses 2014 at [employer] and to return 2015. He is not to drive until follow up in office.  Gwendolyn Lima. Manson Passey, PAC, MBA, MHA [facility] Pager: [phone] 02/26/2013 2:00 PM  I have personally obtained a history, examined the patient, evaluated imaging results, and formulated the assessment and plan of care. I agree with the above. Delia Heady, MD

## 2013-02-26 NOTE — Progress Notes (Signed)
  Echocardiogram Echocardiogram Transesophageal has been performed.  Taletha Twiford FRANCES 02/26/2013, 11:38 AM

## 2013-02-26 NOTE — Evaluation (Signed)
Occupational Therapy Evaluation Patient Details Name: Marcus Hughes MRN: [id number] DOB: 22-Jul-1985 Today's Date: 02/26/2013 Time: 6213-0865 OT Time Calculation (min): 19 min  OT Assessment / Plan / Recommendation History of present illness Pt admitted with L hemi paresis and aphasia found to have Rt MCA CVA   Clinical Impression   Pt admitted with above.  Pt near baseline but still present with decreased L UE sensation and decreased LUE FM coordination. Will follow acutely for at least one more session to reinforce LUE HEP.  Recommend d/c home when medically appropriate.    OT Assessment  Patient needs continued OT Services    Follow Up Recommendations  No OT follow up    Barriers to Discharge      Equipment Recommendations  None recommended by OT    Recommendations for Other Services    Frequency  Min 2X/week    Precautions / Restrictions Restrictions Weight Bearing Restrictions: No   Pertinent Vitals/Pain See vitals    ADL  Toilet Transfer: Simulated;Modified independent Toilet Transfer Method: Sit to Barista:  (bed>w/c) ADL Comments: Pt demonstrating decreased functional use in LUE. Educated pt on safety awareness when at home and in community to help prevent injury to LUE due to sensory deficit (re: use of hot surfaces, heavy work equipment or sharp objects).   Provided pt with FM handout and list of activities that he can do to increase coordination.  Transport arrived at end of session to take pt to TEE, will focus on HEP next session.    OT Diagnosis: Paresis  OT Problem List: Impaired UE functional use;Decreased coordination OT Treatment Interventions: Patient/family education;Therapeutic activities;Therapeutic exercise   OT Goals(Current goals can be found in the care plan section) Acute Rehab OT Goals Patient Stated Goal: to figure out why this happened OT Goal Formulation: With patient Time For Goal Achievement: 03/05/13 Potential  to Achieve Goals: Good  Visit Information  Last OT Received On: 02/26/13 Assistance Needed: +1 History of Present Illness: Pt admitted with L hemi paresis and aphasia found to have Rt MCA CVA       Prior Functioning     Home Living Family/patient expects to be discharged to:: Private residence Living Arrangements: Spouse/significant other Available Help at Discharge: Family;Available PRN/intermittently Type of Home: Apartment Home Access: Stairs to enter Entrance Stairs-Number of Steps: 3 flights of stairs Entrance Stairs-Rails: Right Home Layout: One level Prior Function Level of Independence: Independent Communication Communication: No difficulties Dominant Hand: Right         Vision/Perception Vision - History Baseline Vision: No visual deficits   Cognition  Cognition Arousal/Alertness: Awake/alert Behavior During Therapy: WFL for tasks assessed/performed Overall Cognitive Status: Within Functional Limits for tasks assessed    Extremity/Trunk Assessment Upper Extremity Assessment Upper Extremity Assessment: LUE deficits/detail LUE Sensation: decreased light touch;decreased proprioception LUE Coordination: decreased fine motor     Mobility Bed Mobility Bed Mobility: Supine to Sit;Sitting - Scoot to Edge of Bed Supine to Sit: 6: Modified independent (Device/Increase time) Sitting - Scoot to Edge of Bed: 6: Modified independent (Device/Increase time) Transfers Transfers: Sit to Stand;Stand to Sit Sit to Stand: 6: Modified independent (Device/Increase time) Stand to Sit: 6: Modified independent (Device/Increase time)     Exercise     Balance     End of Session OT - End of Session Activity Tolerance: Patient tolerated treatment well Patient left:  (in w/c going to procedure)  GO    02/26/2013 Cipriano Mile OTR/L Pager [phone]  Office [phone]  Cipriano Mile 02/26/2013, 9:52 AM

## 2013-02-26 NOTE — H&P (View-Only) (Signed)
Stroke Team Progress Note  HISTORY Marcus Hughes is an 27 y.o. male with no known previous medical history who experienced sudden onset of weakness involving his left arm and left leg about 3:00 today 02/23/2013. Patient fell to the ground and describes the fall as tripping over his leg. He noticed difficulty with use of his left extremities prior to falling. He hit his head when he fell but doesn't think he lost consciousness. He presented to [facility] where CT scan of his head showed no acute intracranial abnormality. There was an equivocal increase in density of his right MCA, however. He was deemed a candidate for TPA which was administered at [facility] prior to obtaining a CT angiogram of the head and neck, and subsequent transfer to [facility] for further management. 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 NIH stroke score on arrival to [facility] was 12. Formal catheter angiography was performed by Dr. Corliss Skains which showed a complete occlusion of the RT MCA at its origin. Complete TICI 3 revascularization was obtained with 9 mg of IATPA and 2 passes with the Solitaire FR retrieval device. He was admitted to the neuro ICU for further evaluation and treatment.  SUBJECTIVE Pt awake in the bed. Explaining to the MD how his stroke started. Wants to know what changes he needs to make in his life as he is scared. His fiance arrived during rounds. Reports night sweats for the past 1 month. No significant change in weight or the way he feels.   OBJECTIVE Most recent Vital Signs: Filed Vitals:   02/25/13 0600 02/25/13 0700 02/25/13 0800 02/25/13 0819  BP: 99/64 114/72 118/72   Pulse:      Temp:    98.7 F (37.1 C)  TempSrc:    Oral  Resp: 16 17 18    Height:      Weight:      SpO2:       CBG (last 3)   Recent Labs  02/23/13 1639  GLUCAP 123*    IV Fluid Intake:   . sodium chloride 75 mL/hr at 02/25/13 0800  . niCARDipine      MEDICATIONS  . aspirin EC  325 mg Oral  Daily  . influenza vac split quadrivalent PF  0.5 mL Intramuscular Tomorrow-1000  . pantoprazole (PROTONIX) IV  40 mg Intravenous QHS   PRN:  acetaminophen, acetaminophen, labetalol, ondansetron (ZOFRAN) IV  Diet:  General thin liquids Activity:  Up in chair, progress activity DVT Prophylaxis:  SCDs   CLINICALLY SIGNIFICANT STUDIES Basic Metabolic Panel:   Recent Labs Lab 02/23/13 1602 02/24/13 0155  NA 137 138  K 3.2* 3.2*  CL 101 107  CO2 24 20  GLUCOSE 125* 87  BUN 17 11  CREATININE 1.29 1.00  CALCIUM 9.7 7.8*   Liver Function Tests:   Recent Labs Lab 02/23/13 1602  AST 47*  ALT 31  ALKPHOS 41  BILITOT 0.6  PROT 7.8  ALBUMIN 4.6   CBC:   Recent Labs Lab 02/23/13 1602 02/24/13 0155  WBC 5.0 6.7  NEUTROABS 2.4 4.0  HGB 14.3 13.0  HCT 39.9 35.6*  MCV 86.9 86.4  PLT 212 192   Coagulation:   Recent Labs Lab 02/23/13 1620  LABPROT 12.8  INR 0.98   Cardiac Enzymes:   Recent Labs Lab 02/23/13 1602  TROPONINI <0.30   Urinalysis:   Recent Labs Lab 02/23/13 1703  COLORURINE YELLOW  LABSPEC 1.025  PHURINE 6.0  GLUCOSEU NEGATIVE  HGBUR NEGATIVE  BILIRUBINUR NEGATIVE  KETONESUR NEGATIVE  PROTEINUR NEGATIVE  UROBILINOGEN 0.2  NITRITE NEGATIVE  LEUKOCYTESUR NEGATIVE   Lipid Panel    Component Value Date/Time   CHOL 166 02/24/2013 0155   TRIG 112 02/24/2013 0155   HDL 39* 02/24/2013 0155   CHOLHDL 4.3 02/24/2013 0155   VLDL 22 02/24/2013 0155   LDLCALC 105* 02/24/2013 0155   HgbA1C  Lab Results  Component Value Date   HGBA1C 5.6 02/24/2013    Urine Drug Screen:     Component Value Date/Time   LABOPIA NONE DETECTED 02/23/2013 1703   COCAINSCRNUR NONE DETECTED 02/23/2013 1703   LABBENZ NONE DETECTED 02/23/2013 1703   AMPHETMU NONE DETECTED 02/23/2013 1703   THCU POSITIVE* 02/23/2013 1703   LABBARB NONE DETECTED 02/23/2013 1703    Alcohol Level:   Recent Labs Lab 02/23/13 1602  ETH <11    CT of the brain   02/23/2013    1. Sub cm high attenuation area in the posterior right putamen, contrast staining versus small petechial hemorrhage. Followup CT should be able to differentiate. 2. No large vessel territory infarct currently apparent.  02/23/2013    Suspected right-sided hyperdense MCA sign with findings suggestive of mild diffuse right-sided cerebral edema with associated minimal (approximately 3 mm) of right to left midline shift. While there is relative preservation of the gray-white junction, these findings are worrisome for an early right MCA distribution infarct. Further evaluation with MRI may be performed as clinically indicated.   CT Angio Head 02/23/2013   Occlusion of the right middle cerebral artery at its origin. Reconstitution 1 cm distal to that.  Mild swelling of the right hemisphere.  No hemorrhage.    CT Angio Neck   02/23/2013      Normal CT angiography of the neck vessels.    CT Cervical Spine 02/23/2013  No fracture or static subluxation of the cervical spine.    Cerebral Angiogram RT CFA approach.  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 S/P complete TICI 3 revascularization with 9 mg of IATPA and 2 passes with the Solitaire FR retrieval device.  MRI of the brain  02/23/2013 1. Moderate size acute right MCA infarct centered at the right basal ganglia and insula / operculum. There is a small volume of petechial hemorrhage, and there is mass effect with 4 mm of leftward midline shift. No malignant hemorrhagic transformation. No ventriculomegaly. 2. No other acute intracranial abnormality.  MRA of the brain  See angio  2D Echocardiogram  EF 50-55% with no source of embolus.   Carotid Doppler  See angio  LE Venous Dopplers   CXR   02/23/2013   Normal exam.   02/23/2013   1. Endotracheal tube in good position. 2. Left lower lobe atelectasis or collapse.    CT Chest W Contrast 02/23/2013  No acute intrathoracic abnormality. Small AP window lymph node measuring approximately 4 mm incidentally  noted.   CT cervical - No fracture or static subluxation of the cervical spine. Scattered shoddy bilateral cervical lymph nodes are individually not enlarged by size criteria. No bulky cervical lymphadenopathy on this noncontrast examination.  EKG  normal sinus rhythm.   Therapy Recommendations   GENERAL EXAM: Patient is in no distress  CARDIOVASCULAR: Regular rate and rhythm, no murmurs, no carotid bruits  NEUROLOGIC: MENTAL STATUS: awake, alert, language fluent, comprehension intact, naming intact; NO EXTINCTION. NO NEGLECT. CRANIAL NERVE: pupils equal and reactive to light, visual fields full to confrontation, extraocular muscles intact, no  nystagmus, facial sensation and strength symmetric, uvula midline, shoulder shrug symmetric, tongue midline. MOTOR: normal bulk and tone, full strength in the RUE, RLE. SUBTLE WEAKNESS IN LUE AND LLE. LUE 5-/5 TRICEPS AND GRIP, LLE HF 4+, DF 5-.  SENSORY: DECR TO LT, TEMP IN LUE AND LLE. COORDINATION: finger-nose-finger SLOW IN LUE.  REFLEXES: deep tendon reflexes present and symmetric; DOWNGOING TOES. GAIT/STATION: narrow based gait   ASSESSMENT Marcus Hughes is a 27 y.o. male presenting with left sided weakness while playing basketball, he fell, ? LOC. Status post IV t-PA 02/23/2013 at 1659. Cerebral angio confrimed complete occlusion of R MCA at the origin. He was revascularized with IA tPA and mechanical thrombectomy with Solitaire. Imaging confirms a right MCA infarct. MRI confirms moderate size right basal ganglia and insula / operculum infarct with a small volume of petechial hemorrhage (non-symptomatic), and there is cerebral edema with mass effect, 4 mm leftward midline shift. Infarct felt to be embolic secondary to unknown source.  On no antithrombotics prior to admission. Now on aspirin 325 mg orally every day for secondary stroke prevention. Patient with resultant VDRF (resolved), sheath(s) (removed), left hemiparesis, dysphagia. Work  up underway.   VDRF post neurointervention, extubated 02/24/2013, CCM signed off Hyperlipidemia, LDL 105, on no statin PTA, now on no statin, goal LDL < 100 (< 70 for diabetics)  Hypokalemia, 3.2 replaced by eLink  Hx Cocaine use  Marijuana use, positive on admission  etoh use  CT chest with borderline lymph nodes  Night sweats  MilitaryOut of country service  Reported headache prior to stroke event which is unusual for him  Hospital day # 2  TREATMENT/PLAN  Continue aspirin 325 mg orally every day for secondary stroke prevention   Transfer to the floor  OOB. Therapy evals  Check LE dopplers for source of stroke  Check Hypercoagulable panel (minus factor 5 leiden and beta-2-glycoprotein as these test for venous clots) and vasculitic labs (C3, C4, CH50, ANA, ESR) HIV and RPR   Sickle cell screen  Add statin  TEE to look for embolic source. Arranged with [facility] for tomorrow.  If positive for PFO (patent foramen ovale), check bilateral lower extremity venous dopplers to rule out DVT as possible source of stroke.   quantiferon to assess for TB  Tylenol for headache  Consider malignancy workup given 1 month history of night sweats and borderline lymph nodes, as potential source of stroke  Annie Main, MSN, RN, ANVP-BC, ANP-BC, GNP-BC [facility] Pager: [phone] 02/25/2013 9:15 AM  I evaluated and examined patient, reviewed records, labs and imaging, and agree with note and plan. Will complete stroke workup (TEE, hypercoag panel, hemoglobinopathy) and consider malignancy workup given his 1 month history of night sweats and borderline lymph nodes.  Suanne Marker, MD 02/25/2013, 10:01 AM Certified in Neurology, Neurophysiology and Neuroimaging [facility] - Stroke Team  Please refer to [email or web address] for on-call Stroke MD

## 2013-02-26 NOTE — Progress Notes (Signed)
Pt c/o 9/10 headache not relieved by tylenol. Neuro stable. VSS. Dr. Amada Jupiter notified. Orders received. Will continue to monitor.

## 2013-02-26 NOTE — Progress Notes (Signed)
Talked to patient about discharge planning; patient is aware of not driving for at least 2 weeks and stated that he has family members that can transport him as needed. Patient also stated that he plans to slow down and take a break from school for a few days; He does not have a PCP; PCP choices offered - patient request to go to [facility]; apt made with Kathlynn Grate NP for Mar 17, 2013 at 4:15pm; information given to patient; lots of emotional support givenCherene Altes [phone]

## 2013-02-26 NOTE — Discharge Summary (Signed)
Stroke Discharge Summary  Patient ID: Marcus Hughes   MRN: [id number]      DOB: 1985-07-13  Date of Admission: 02/23/2013 Date of Discharge: 02/26/2013  Attending Physician:  Darcella Cheshire, MD, Stroke MD  Consulting Physician(s):   Treatment Team:  Md Stroke, MD rehabilitation medicine  Pulmonary/CCM Patient's PCP:  No primary provider on file.  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  History reviewed. No pertinent past medical history. Past Surgical History  Procedure Laterality Date  . Radiology with anesthesia N/A 02/23/2013    Procedure: RADIOLOGY WITH ANESTHESIA;  Surgeon: Oneal Grout, MD;  Location: [facility] OR;  Service: Radiology;  Laterality: N/A;      Medication List         aspirin 325 MG EC tablet  Take 1 tablet (325 mg total) by mouth daily.     simvastatin 20 MG tablet  Commonly known as:  ZOCOR  Take 1 tablet (20 mg total) by mouth daily at 6 PM.        LABORATORY STUDIES CBC    Component Value Date/Time   WBC 6.7 02/24/2013 0155   RBC 4.12* 02/24/2013 0155   HGB 13.0 02/24/2013 0155   HCT 35.6* 02/24/2013 0155   PLT 192 02/24/2013 0155   MCV 86.4 02/24/2013 0155   MCH 31.6 02/24/2013 0155   MCHC 36.5* 02/24/2013 0155   RDW 12.5 02/24/2013 0155   LYMPHSABS 2.3 02/24/2013 0155   MONOABS 0.3 02/24/2013 0155   EOSABS 0.1 02/24/2013 0155   BASOSABS 0.0 02/24/2013 0155   CMP    Component Value Date/Time   NA 138 02/24/2013 0155   K 3.2* 02/24/2013 0155   CL 107 02/24/2013 0155   CO2 20 02/24/2013 0155   GLUCOSE 87 02/24/2013 0155   BUN 11 02/24/2013 0155   CREATININE 1.00 02/24/2013 0155    CALCIUM 7.8* 02/24/2013 0155   PROT 7.8 02/23/2013 1602   ALBUMIN 4.6 02/23/2013 1602   AST 47* 02/23/2013 1602   ALT 31 02/23/2013 1602   ALKPHOS 41 02/23/2013 1602   BILITOT 0.6 02/23/2013 1602   GFRNONAA >90 02/24/2013 0155   GFRAA >90 02/24/2013 0155   COAGS Lab Results  Component Value Date   INR 0.98 02/23/2013   Lipid Panel    Component Value Date/Time   CHOL 166 02/24/2013 0155   TRIG 112 02/24/2013 0155   HDL 39* 02/24/2013 0155   CHOLHDL 4.3 02/24/2013 0155   VLDL 22 02/24/2013 0155   LDLCALC 105* 02/24/2013 0155   HgbA1C  Lab Results  Component Value Date   HGBA1C 5.6 02/24/2013   Cardiac Panel (last 3 results)  Recent Labs  02/23/13 1602  TROPONINI <0.30   Urinalysis    Component Value Date/Time   COLORURINE YELLOW 02/23/2013 1703   APPEARANCEUR HAZY* 02/23/2013 1703   LABSPEC 1.025 02/23/2013 1703   PHURINE 6.0 02/23/2013 1703   GLUCOSEU NEGATIVE 02/23/2013 1703   HGBUR NEGATIVE 02/23/2013 1703   BILIRUBINUR NEGATIVE 02/23/2013 1703   KETONESUR NEGATIVE 02/23/2013 1703   PROTEINUR NEGATIVE 02/23/2013 1703   UROBILINOGEN 0.2 02/23/2013 1703   NITRITE NEGATIVE 02/23/2013  1703   LEUKOCYTESUR NEGATIVE 02/23/2013 1703   Urine Drug Screen    Component Value Date/Time   LABOPIA NONE DETECTED 02/23/2013 1703   COCAINSCRNUR NONE DETECTED 02/23/2013 1703   LABBENZ NONE DETECTED 02/23/2013 1703   AMPHETMU NONE DETECTED 02/23/2013 1703   THCU POSITIVE* 02/23/2013 1703   LABBARB NONE DETECTED 02/23/2013 1703    Alcohol Level    Component Value Date/Time   ETH <11 02/23/2013 1602     SIGNIFICANT DIAGNOSTIC STUDIES Hypercoagulable Workup  Normal - C3, C4, RPR, ESR, HIV, Anti III, homocysteine  Pending - CH50, ANA, Prot C activity, Protein C total, Protein S activity, Protein S total, lupus anticoagulant, cardiolipin antibody, prothrombin gene mutation  Quantiferon pending  Sickle Cell screen neg  CT of the brain  02/23/2013 1. Sub cm high  attenuation area in the posterior right putamen, contrast staining versus small petechial hemorrhage. Followup CT should be able to differentiate. 2. No large vessel territory infarct currently apparent.  02/23/2013 Suspected right-sided hyperdense MCA sign with findings suggestive of mild diffuse right-sided cerebral edema with associated minimal (approximately 3 mm) of right to left midline shift. While there is relative preservation of the gray-white junction, these findings are worrisome for an early right MCA distribution infarct. Further evaluation with MRI may be performed as clinically indicated.  CT Angio Head 02/23/2013 Occlusion of the right middle cerebral artery at its origin. Reconstitution 1 cm distal to that. Mild swelling of the right hemisphere. No hemorrhage.  CT Angio Neck 02/23/2013 Normal CT angiography of the neck vessels.  CT Cervical Spine 02/23/2013 No fracture or static subluxation of the cervical spine.  Cerebral Angiogram RT CFA approach. 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 S/P complete TICI 3 revascularization with 9 mg of IATPA and 2 passes with the Solitaire FR retrieval device.  MRI of the brain 02/23/2013 1. Moderate size acute right MCA infarct centered at the right basal ganglia and insula / operculum. There is a small volume of petechial hemorrhage, and there is mass effect with 4 mm of leftward midline shift. No malignant hemorrhagic transformation. No ventriculomegaly. 2. No other acute intracranial abnormality.  MRA of the brain See angio  2D Echocardiogram EF 50-55% with no source of embolus.  TEE No ASD or PFO by color Doppler or bubble study, LAA clear with no thrombus  Carotid Doppler See angio  LE Venous Dopplers 02/25/2013 no evidence of DVT  CXR  02/23/2013 Normal exam.  02/23/2013 1. Endotracheal tube in good position. 2. Left lower lobe atelectasis or collapse.  CT Chest W Contrast 02/23/2013 No acute intrathoracic abnormality. Small AP window lymph  node measuring approximately 4 mm incidentally noted.  CT cervical - No fracture or static subluxation of the cervical spine. Scattered shoddy bilateral cervical lymph nodes are individually not enlarged by size criteria. No bulky cervical lymphadenopathy on this noncontrast examination.  EKG normal sinus rhythm.       History of Present Illness    Marcus Hughes is an 27 y.o. male with no known previous medical history who experienced sudden onset of weakness involving his left arm and left leg about 3:00 today 02/23/2013. Patient fell to the ground and describes the fall as tripping over his leg. He noticed difficulty with use of his left extremities prior to falling. He hit his head when he fell but doesn't think he lost consciousness. He presented to [facility] where CT scan of his head showed no acute intracranial abnormality. There was an  equivocal increase in density of his right MCA, however. He was deemed a candidate for TPA which was administered at [facility] prior to obtaining a CT angiogram of the head and neck, and subsequent transfer to [facility] for further management. 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 NIH stroke score on arrival to [facility] was 12. Formal catheter angiography was performed by Dr. Corliss Skains which showed a complete occlusion of the RT MCA at its origin. Complete TICI 3 revascularization was obtained with 9 mg of IATPA and 2 passes with the Solitaire FR retrieval device. He was admitted to the neuro ICU for further evaluation and treatment    Hospital Course   [name] is a 27 y.o. male presenting with left sided weakness while playing basketball, he fell, ? LOC. Status post IV t-PA 02/23/2013 at 1659. Cerebral angio confrimed complete occlusion of R MCA at the origin. He was revascularized with IA tPA and mechanical thrombectomy with Solitaire device. Imaging confirmed a right MCA infarct. MRI confirms moderate size right basal ganglia  and insula / operculum infarct with a small volume of petechial hemorrhage (non-symptomatic), and there is cerebral edema with mass effect, 4 mm leftward midline shift. Infarct felt to be embolic secondary to unknown source. On no antithrombotics prior to admission. Now on aspirin 325 mg orally every day for secondary stroke prevention. Patient with resultant VDRF (resolved), sheath(s) (removed), left hemiparesis (resolved), dysphagia (resolved).  VDRF post neurointervention, extubated 02/24/2013, CCM signed off Hyperlipidemia, LDL 105, on no statin PTA, now on zocor 20, goal LDL < 100  Hypokalemia, replaced  Hx Cocaine use, neg on admission  Marijuana use, positive on admission, cessation counseling.  Etoh use, cessation counseling CT chest with borderline lymph nodes. Discussed with patient. Follow up with Primary MD Night sweats  Military Out of country service  Reported headache prior to stroke event which is unusual for him. Headache with light sensitivity this am Hospital day # 3  TREATMENT/PLAN  Continue aspirin 325 mg orally every day for secondary stroke prevention  No therapy needs  F/u Hypercoagulable lab results, so far unrevealing  TEE shows no embolic source. LE dopplers negative for DVT.  F/u quantiferon to assess for TB Outpatient TCD bubble study with emboli monitoring with Dr. Pearlean Brownie in 1 month to further evaluate for possible PFO. Patient to call for appointment. (I have added to discharge instruction sheet).  Follow up with Dr. Pearlean Brownie in 2 months in stroke clinic.  He has already planned to find outpatient physician and I encouraged him to make appt within next 2 weeks.  I have written a note to have him excused from Fall semester college courses 2014 at [employer] and to return 2015.  He is not to drive until follow up in office.   Patient with vascular risk factors of:   hyperlipidemia  Patient with continued stroke symptoms of left sided numbness however, physical  therapy, occupational therapy and speech therapy evaluated patient. They recommend no therapy.  Discharge Exam  Blood pressure 138/92, pulse 68, temperature 97.9 F (36.6 C), temperature source Oral, resp. rate 16, height 5\' 11"  (1.803 m), weight 93.8 kg (206 lb 12.7 oz), SpO2 100.00%.   GENERAL EXAM:  Patient is in no distress  CARDIOVASCULAR:  Regular rate and rhythm, no murmurs, no carotid bruits  NEUROLOGIC:  MENTAL STATUS: awake, alert, language fluent, comprehension intact, naming intact; NO EXTINCTION. NO NEGLECT.  CRANIAL NERVE: pupils equal and reactive to light,  visual fields full to confrontation, extraocular muscles intact, no nystagmus, facial sensation and strength symmetric, uvula midline, shoulder shrug symmetric, tongue midline.  MOTOR: normal bulk and tone, full strength in the RUE, RLE. SUBTLE WEAKNESS IN LUE AND LLE. LUE 5-/5 TRICEPS AND GRIP, LLE HF 4+, DF 5-.  SENSORY: DECR TO LT, TEMP IN LUE AND LLE.  COORDINATION: finger-nose-finger SLOW IN LUE.  REFLEXES: deep tendon reflexes present and symmetric; DOWNGOING TOES.  GAIT/STATION: narrow based gait  Discharge Diet   General thin liquids  Discharge Plan    Disposition:  home   aspirin 325 mg orally every day for secondary stroke prevention.  Ongoing risk factor control by Primary Care Physician. Risk factor recommendations:  Hypertension target range 130-140/70-80 Lipid range - LDL < 100 and checked every 6 months, fasting Alcohol, Substance abuse counseling     Follow-up with primary provider within 1 month.  Follow-up with Dr. Delia Heady, Stroke Clinic in 1 months.  60 minutes were spent preparing discharge.  Signed Gwendolyn Lima. Manson Passey, [facility], MBA, MHA [facility] Pager: [phone] 02/26/2013 4:02 PM  I have personally examined this patient, reviewed pertinent data and developed the plan of care. I agree with above. Delia Heady, MD

## 2013-02-26 NOTE — Interval H&P Note (Signed)
History and Physical Interval Note:  02/26/2013 11:10 AM  Marcus Hughes  has presented today for surgery, with the diagnosis of STROKE  The various methods of treatment have been discussed with the patient and family. After consideration of risks, benefits and other options for treatment, the patient has consented to  Procedure(s): TRANSESOPHAGEAL ECHOCARDIOGRAM (TEE) (N/A) as a surgical intervention .  The patient's history has been reviewed, patient examined, no change in status, stable for surgery.  I have reviewed the patient's chart and labs.  Questions were answered to the patient's satisfaction.     Elyn Aquas.

## 2013-02-26 NOTE — Progress Notes (Signed)
Pt. Continues to c/o severe HA with pressure under right eye not relieved by Tylenol; now c/o sensitivity to light. MAEW; NIH-1. BP 131/82. Marcus December, NP notified; orders received. Will monitor.

## 2013-02-27 ENCOUNTER — Encounter (HOSPITAL_COMMUNITY): Payer: Self-pay | Admitting: Cardiovascular Disease

## 2013-02-27 LAB — CARDIOLIPIN ANTIBODIES, IGG, IGM, IGA: Anticardiolipin IgG: 3 GPL U/mL — ABNORMAL LOW (ref <23)

## 2013-02-27 LAB — PROTEIN C, TOTAL: Protein C, Total: 77 % (ref 72–160)

## 2013-03-02 ENCOUNTER — Ambulatory Visit: Payer: BC Managed Care – PPO | Admitting: Emergency Medicine

## 2013-03-02 DIAGNOSIS — I63419 Cerebral infarction due to embolism of unspecified middle cerebral artery: Secondary | ICD-10-CM | POA: Insufficient documentation

## 2013-03-02 DIAGNOSIS — I6330 Cerebral infarction due to thrombosis of unspecified cerebral artery: Secondary | ICD-10-CM

## 2013-03-02 MED ORDER — TRAMADOL HCL 50 MG PO TABS: 100.0000 mg | ORAL_TABLET | Freq: Four times a day (QID) | ORAL | Status: DC | PRN

## 2013-03-02 NOTE — Progress Notes (Signed)
[facility] [address], [place] [address] [phone]  Date:  03/02/2013   Name:  Marcus Hughes   DOB:  10-11-1985   MRN:  [id number]  PCP:  Tonye Pearson, MD    Chief Complaint: Headache and Medication Refill   History of Present Illness:  Marcus Hughes is a 27 y.o. very pleasant male patient who presents with the following:  The patient is a 27 year old man just discharged from [facility] following an MCA infarct.  He  experienced sudden onset of weakness involving his left arm and left leg about 3:00 02/23/2013. Patient fell to the ground and describes the fall as tripping over his leg. He noticed difficulty with use of his left extremities prior to falling. He hit his head when he fell but doesn't think he lost consciousness. He presented to [facility] where CT scan of his head showed no acute intracranial abnormality. There was an equivocal increase in density of his right MCA, however. He was deemed a candidate for TPA which was administered at [facility] prior to obtaining a CT angiogram of the head and neck, and subsequent transfer to [facility] for further management. 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 NIH stroke score on arrival to [facility] was 12. Formal catheter angiography was performed by Dr. Corliss Skains which showed a complete occlusion of the RT MCA at its origin. Complete TICI 3 revascularization was obtained with 9 mg of IATPA and 2 passes with the Solitaire FR retrieval device.  He remained in the ICU and was extubated and finally discharged.    Since his hospitalization, he has been experiencing severe and frequent headaches requiring tramadol for treatment.  He is going to run out tonight at the prescribed rate of use.     Patient Active Problem List   Diagnosis Date Noted  . Acute ischemic right MCA stroke 02/26/2013  . Other and unspecified hyperlipidemia 02/26/2013  . Headache 02/26/2013  . Photophobia 02/26/2013  .  Encounter for long-term (current) use of medications 02/26/2013  . Night sweats 02/25/2013  . Headache(784.0) 02/25/2013  . Acute embolic stroke 02/24/2013  . Marijuana abuse 02/24/2013  . Hypokalemia 02/24/2013    Past Medical History  Diagnosis Date  . Stroke     Past Surgical History  Procedure Laterality Date  . Radiology with anesthesia N/A 02/23/2013    Procedure: RADIOLOGY WITH ANESTHESIA;  Surgeon: Oneal Grout, MD;  Location: [facility] OR;  Service: Radiology;  Laterality: N/A;  . Tee without cardioversion N/A 02/26/2013    Procedure: TRANSESOPHAGEAL ECHOCARDIOGRAM (TEE);  Surgeon: Vesta Mixer, MD;  Location: [facility] ENDOSCOPY;  Service: Cardiovascular;  Laterality: N/A;  . Hernia repair    . Rotator cuff repair      History  Substance Use Topics  . Smoking status: Never Smoker   . Smokeless tobacco: Not on file  . Alcohol Use: Yes    No family history on file.  No Known Allergies  Medication list has been reviewed and updated.  Current Outpatient Prescriptions on File Prior to Visit  Medication Sig Dispense Refill  . aspirin EC 325 MG EC tablet Take 1 tablet (325 mg total) by mouth daily.  30 tablet  0  . simvastatin (ZOCOR) 20 MG tablet Take 1 tablet (20 mg total) by mouth daily at 6 PM.  30 tablet  2  . traMADol (ULTRAM) 50 MG tablet Take 2 tablets (100 mg total)  by mouth every 6 (six) hours as needed for moderate pain or severe pain.  30 tablet  0   No current facility-administered medications on file prior to visit.    Review of Systems:  As per HPI, otherwise negative.    Physical Examination: Filed Vitals:   03/02/13 1729  BP: 118/82  Pulse: 66  Temp: 98.4 F (36.9 C)  Resp: 18   Filed Vitals:   03/02/13 1729  Height: 6' (1.829 m)  Weight: 197 lb 9.6 oz (89.631 kg)   Body mass index is 26.79 kg/(m^2). Ideal Body Weight: Weight in (lb) to have BMI = 25: 183.9  GEN: WDWN, NAD, Non-toxic, A & O x 3 HEENT: Atraumatic, Normocephalic. Neck  supple. No masses, No LAD. Ears and Nose: No external deformity. CV: RRR, No M/G/R. No JVD. No thrill. No extra heart sounds. PULM: CTA B, no wheezes, crackles, rhonchi. No retractions. No resp. distress. No accessory muscle use. ABD: S, NT, ND, +BS. No rebound. No HSM. EXTR: No c/c/e NEURO Normal gait.  PSYCH: Normally interactive. Conversant. Not depressed or anxious appearing.  Calm demeanor.    Assessment and Plan: Post CVA headache rx tramadol   Signed,  Phillips Odor, MD

## 2013-03-02 NOTE — Patient Instructions (Signed)
Cluster Headache Cluster headaches are recognized by their pattern of deep, intense head pain. They normally occur on one side of the head. Typically, cluster headaches:  Are severe in nature.  Occur repeatedly over weeks to months at a time and are then followed by periods of no headaches.  Can last 15 minutes to 3 hours.  Occur at the same time each day, often at night.  Occur several times a day. CAUSES The exact cause of a cluster headache is not known. Some things can trigger a cluster headache, such as:  Smoking.  Alcohol use.  Lack of sleep.  High altitude travel, such as airline travel.  Change of seasons.  Certain foods, such as foods or drinks that contain nitrates, glutamate, aspartame, or tyramine. SYMPTOMS   Severe pain that begins in or around the eye or temple.  One-sided head pain.  Feeling sick to your stomach (nauseous).  Sensitivity to light.  Runny nose.  Eye redness, tearing, and nasal stuffiness on the side of your head where you are experiencing pain.  Sweaty, pale skin of the face.  Droopy eyelid. DIAGNOSIS  Cluster headaches are diagnosed based on symptoms and physical exam. Your caregiver may order a computerized X-ray scan (CT or CAT scan) or a computerized magnetic scan (MRI) of your head or other lab tests to see if your headaches are caused from other medical conditions.  TREATMENT   Medications for pain relief and to prevent recurrent attacks.  Oxygen for pain relief.  Biofeedback programs may help reduce headache pain. Some people may need a combination of medicines for cluster headaches. It may be helpful to keep a headache diary. This may help you find a trend for what is triggering your headaches. Your caregiver can develop a treatment plan that can be helpful to you.  HOME CARE INSTRUCTIONS  During cluster periods:  Follow a regular sleep schedule.Do not vary the amount and time that you sleep from day to day. It is  important to stay on the same schedule during a cluster period to help prevent headaches.  Avoid alcohol.  Stop smoking. SEEK IMMEDIATE MEDICAL CARE IF:   You have any changes from your previous cluster headaches either in intensity or frequency.  You are not getting relief from medicines you are taking.  You faint.  You develop weakness or numbness, especially on one side of your body or face.  You develop double vision.  You develop nausea or vomiting.  You cannot keep your balance or have difficulty talking or walking.  You develop neck pain or neck stiffness.  You have a fever. Document Released: 03/27/2005 Document Revised: 06/19/2011 Document Reviewed: 10/17/2012 ExitCare Patient Information 2014 [employer].  

## 2013-03-03 LAB — QUANTIFERON TB GOLD ASSAY (BLOOD)
Interferon Gamma Release Assay: NEGATIVE
Mitogen value: >10 IU/mL
Quantiferon Nil Value: 0.02 IU/mL
TB Antigen Minus Nil Value: 0.02 IU/mL

## 2013-03-10 ENCOUNTER — Telehealth: Payer: Self-pay | Admitting: Neurology

## 2013-03-10 NOTE — Telephone Encounter (Signed)
Patient called. He wants to know when he can get back to playing sports and back to work. He states that his life does not allow him to take two months off of work. He is requesting that we schedule his appointment as soon as possible. I discussed with him the rationale for waiting longer. I scheduled appointment one month out for new patient visit.

## 2013-03-17 ENCOUNTER — Encounter: Payer: Self-pay | Admitting: Family

## 2013-03-17 ENCOUNTER — Ambulatory Visit (INDEPENDENT_AMBULATORY_CARE_PROVIDER_SITE_OTHER): Payer: BC Managed Care – PPO | Admitting: Family

## 2013-03-17 ENCOUNTER — Telehealth: Payer: Self-pay | Admitting: Family

## 2013-03-17 VITALS — BP 116/72 | HR 68 | Ht 71.5 in | Wt 195.0 lb

## 2013-03-17 DIAGNOSIS — I6350 Cerebral infarction due to unspecified occlusion or stenosis of unspecified cerebral artery: Secondary | ICD-10-CM

## 2013-03-17 DIAGNOSIS — F410 Panic disorder [episodic paroxysmal anxiety] without agoraphobia: Secondary | ICD-10-CM

## 2013-03-17 DIAGNOSIS — I639 Cerebral infarction, unspecified: Secondary | ICD-10-CM

## 2013-03-17 DIAGNOSIS — F411 Generalized anxiety disorder: Secondary | ICD-10-CM

## 2013-03-17 NOTE — Telephone Encounter (Signed)
Pt states he forgot to mention during his ov that he will be out of his traMADol (ULTRAM) 50 MG tablet in about a week.  He will need a refill before he runs out.

## 2013-03-17 NOTE — Patient Instructions (Signed)
Stroke Prevention Some medical conditions and behaviors are associated with an increased chance of having a stroke. You may prevent a stroke by making healthy choices and managing medical conditions. Reduce your risk of having a stroke by:  Staying physically active. Get at least 30 minutes of activity on most or all days.  Not smoking. It may also be helpful to avoid exposure to secondhand smoke.  Limiting alcohol use. Moderate alcohol use is considered to be:  No more than 2 drinks per day for men.  No more than 1 drink per day for nonpregnant women.  Eating healthy foods.  Include 5 or more servings of fruits and vegetables a day.  Certain diets may be prescribed to address high blood pressure, high cholesterol, diabetes, or obesity.  Managing your cholesterol levels.  A low-saturated fat, low-trans fat, low-cholesterol, and high-fiber diet may control cholesterol levels.  Take any prescribed medicines to control cholesterol as directed by your caregiver.  Managing your diabetes.  A controlled-carbohydrate, controlled-sugar diet is recommended to manage diabetes.  Take any prescribed medicines to control diabetes as directed by your caregiver.  Controlling your high blood pressure (hypertension).  A low-salt (sodium), low-saturated fat, low-trans fat, and low-cholesterol diet is recommended to manage high blood pressure.  Take any prescribed medicines to control hypertension as directed by your caregiver.  Maintaining a healthy weight.  A reduced-calorie, low-sodium, low-saturated fat, low-trans fat, low-cholesterol diet is recommended to manage weight.  Stopping drug abuse.  Avoiding birth control pills.  Talk to your caregiver about the risks of taking birth control pills if you are over 35 years old, smoke, get migraines, or have ever had a blood clot.  Getting evaluated for sleep disorders (sleep apnea).  Talk to your caregiver about getting a sleep evaluation  if you snore a lot or have excessive sleepiness.  Taking medicines as directed by your caregiver.  For some people, aspirin or blood thinners (anticoagulants) are helpful in reducing the risk of forming abnormal blood clots that can lead to stroke. If you have the irregular heart rhythm of atrial fibrillation, you should be on a blood thinner unless there is a good reason you cannot take them.  Understand all your medicine instructions. SEEK IMMEDIATE MEDICAL CARE IF:   You have sudden weakness or numbness of the face, arm, or leg, especially on one side of the body.  You have sudden confusion.  You have trouble speaking (aphasia) or understanding.  You have sudden trouble seeing in one or both eyes.  You have sudden trouble walking.  You have dizziness.  You have a loss of balance or coordination.  You have a sudden, severe headache with no known cause.  You have new chest pain or an irregular heartbeat. Any of these symptoms may represent a serious problem that is an emergency. Do not wait to see if the symptoms will go away. Get medical help right away. Call your local emergency services (911 in [place] Do not drive yourself to the hospital. Document Released: 05/04/2004 Document Revised: 06/19/2011 Document Reviewed: 09/27/2012 ExitCare Patient Information 2014 [employer].  

## 2013-03-17 NOTE — Progress Notes (Signed)
Pre visit review using our clinic review tool, if applicable. No additional management support is needed unless otherwise documented below in the visit note. 

## 2013-03-18 ENCOUNTER — Encounter: Payer: Self-pay | Admitting: Family

## 2013-03-18 MED ORDER — TRAMADOL HCL 50 MG PO TABS: 100.0000 mg | ORAL_TABLET | Freq: Four times a day (QID) | ORAL | Status: DC | PRN

## 2013-03-18 NOTE — Telephone Encounter (Signed)
Rx faxed to pharmacy  

## 2013-03-18 NOTE — Progress Notes (Signed)
Subjective:    Patient ID: Marcus Hughes, male    DOB: [birth year], 27 y.o.   MRN: [id number]  HPI 27 year old [place] American male, new patient to the practice and to be established. He was seen in the emergency department on 02/23/2013 after presenting with acute left-sided weakness. Patient reports falling to the ground and unable to get. [facility], had a CT scan of the hand and it was determined that he had a right MCA. He was a candidate for TPA and received it  Patient feels that he slowly recovered from weakness. However, continues to report numbness and decreased sensation to the left arm. She has not undergone physical therapy was felt that he slowly recovered from a strength standpoint. Reports having episodes of anxiety and panic attacks since this episode happened. He characterizes them by increased sweating, increased heart rate, and feeling like he is about to die. He reports having these episodes approximately twice a week. Typically, he lays down and the episode subsides. He has never had anxiety or panic attacks previously. He is due to follow up with neurology on December 17. He is requesting to go back to work. He's currently taking simvastatin 20 mg once daily and aspirin 325 once daily. He has no family history of strokes.  Review of Systems  Constitutional: Negative.   HENT: Negative.   Respiratory: Negative.   Cardiovascular: Negative.   Gastrointestinal: Negative.   Endocrine: Negative.   Genitourinary: Negative.   Musculoskeletal: Negative.   Skin: Negative.   Allergic/Immunologic: Negative.   Neurological: Negative.  Negative for weakness.  Hematological: Negative.   Psychiatric/Behavioral: Negative.    Past Medical History  Diagnosis Date  . Stroke     History   Social History  . Marital Status: Single    Spouse Name: N/A    Number of Children: N/A  . Years of Education: N/A   Occupational History  . Not on file.   Social  History Main Topics  . Smoking status: Never Smoker   . Smokeless tobacco: Not on file  . Alcohol Use: Yes  . Drug Use: Yes    Special: Cocaine, Marijuana  . Sexual Activity: Not on file   Other Topics Concern  . Not on file   Social History Narrative  . No narrative on file    Past Surgical History  Procedure Laterality Date  . Radiology with anesthesia N/A 02/23/2013    Procedure: RADIOLOGY WITH ANESTHESIA;  Surgeon: Oneal Grout, MD;  Location: [facility] OR;  Service: Radiology;  Laterality: N/A;  . Tee without cardioversion N/A 02/26/2013    Procedure: TRANSESOPHAGEAL ECHOCARDIOGRAM (TEE);  Surgeon: Vesta Mixer, MD;  Location: [facility] ENDOSCOPY;  Service: Cardiovascular;  Laterality: N/A;  . Hernia repair    . Rotator cuff repair      No family history on file.  No Known Allergies  Current Outpatient Prescriptions on File Prior to Visit  Medication Sig Dispense Refill  . aspirin EC 325 MG EC tablet Take 1 tablet (325 mg total) by mouth daily.  30 tablet  0  . simvastatin (ZOCOR) 20 MG tablet Take 1 tablet (20 mg total) by mouth daily at 6 PM.  30 tablet  2  . traMADol (ULTRAM) 50 MG tablet Take 2 tablets (100 mg total) by mouth every 6 (six) hours as needed for moderate pain or severe pain.  100 tablet  0   No current facility-administered medications on file prior to  visit.    BP 116/72  Pulse 68  Ht 5' 11.5" (1.816 m)  Wt 195 lb (88.451 kg)  BMI 26.82 kg/m2chart    Objective:   Physical Exam  Constitutional: He is oriented to person, place, and time. He appears well-developed and well-nourished.  HENT:  Right Ear: External ear normal.  Left Ear: External ear normal.  Nose: Nose normal.  Mouth/Throat: Oropharynx is clear and moist.  Neck: Normal range of motion. Neck supple. No thyromegaly present.  Cardiovascular: Normal rate, regular rhythm and normal heart sounds.   Pulmonary/Chest: Effort normal and breath sounds normal.  Abdominal: Soft. Bowel sounds  are normal. He exhibits no distension. There is no tenderness.  Musculoskeletal: Normal range of motion. He exhibits no edema and no tenderness.  Neurological: He is alert and oriented to person, place, and time. He has normal reflexes. He displays normal reflexes. No cranial nerve deficit. Coordination normal.  Skin: Skin is warm and dry.  Psychiatric: He has a normal mood and affect.          Assessment & Plan:  Assessment: 1. Embolic Stroke 2. Anxiety   Plan: Follow neurology as scheduled on 03/26/2013. Would will let him make the final determination on when he came back to work. In the meantime, advised him cardiovascular exercise. Continue current medications. She'll prevention information provided today. We'll followup in 4 weeks to address panic and anxiety to see if he requires medication intervention.

## 2013-03-20 ENCOUNTER — Encounter: Payer: Self-pay | Admitting: Family

## 2013-03-21 ENCOUNTER — Other Ambulatory Visit: Payer: Self-pay | Admitting: Family

## 2013-03-21 MED ORDER — PAROXETINE HCL 10 MG PO TABS: 10.0000 mg | ORAL_TABLET | Freq: Every day | ORAL | Status: DC

## 2013-03-26 ENCOUNTER — Other Ambulatory Visit: Payer: Self-pay | Admitting: Neurology

## 2013-03-26 ENCOUNTER — Ambulatory Visit: Payer: Self-pay | Admitting: Nurse Practitioner

## 2013-03-26 DIAGNOSIS — I6350 Cerebral infarction due to unspecified occlusion or stenosis of unspecified cerebral artery: Secondary | ICD-10-CM

## 2013-03-28 ENCOUNTER — Encounter: Payer: Self-pay | Admitting: *Deleted

## 2013-03-28 ENCOUNTER — Encounter: Payer: Self-pay | Admitting: Neurology

## 2013-03-28 ENCOUNTER — Ambulatory Visit (INDEPENDENT_AMBULATORY_CARE_PROVIDER_SITE_OTHER): Payer: BC Managed Care – PPO | Admitting: Neurology

## 2013-03-28 ENCOUNTER — Ambulatory Visit (INDEPENDENT_AMBULATORY_CARE_PROVIDER_SITE_OTHER): Payer: Self-pay

## 2013-03-28 ENCOUNTER — Ambulatory Visit (INDEPENDENT_AMBULATORY_CARE_PROVIDER_SITE_OTHER): Payer: BC Managed Care – PPO

## 2013-03-28 VITALS — BP 148/99 | HR 64

## 2013-03-28 DIAGNOSIS — R51 Headache: Secondary | ICD-10-CM

## 2013-03-28 DIAGNOSIS — Z0289 Encounter for other administrative examinations: Secondary | ICD-10-CM

## 2013-03-28 DIAGNOSIS — I6350 Cerebral infarction due to unspecified occlusion or stenosis of unspecified cerebral artery: Secondary | ICD-10-CM

## 2013-03-28 DIAGNOSIS — G44209 Tension-type headache, unspecified, not intractable: Secondary | ICD-10-CM | POA: Insufficient documentation

## 2013-03-28 MED ORDER — DIVALPROEX SODIUM ER 500 MG PO TB24: 500.0000 mg | ORAL_TABLET | Freq: Every day | ORAL | Status: DC

## 2013-03-28 NOTE — Progress Notes (Signed)
[facility] [address] [place]. [place] [address] [phone]       OFFICE FOLLOW-UP NOTE  [name] Date of Birth:  [birth year] Medical Record Number:  [id number]   HPI: Marcus Hughes is an 27 y.o. male with no known previous medical history who experienced sudden onset of weakness involving his left arm and left leg about 3:00  on 02/23/2013. Patient fell to the ground and described the fall as tripping over his leg. He noticed difficulty with use of his left extremities prior to falling. He hit his head when he fell but doesn't think he lost consciousness. He presented to [facility] where CT scan of his head showed no acute intracranial abnormality. There was an equivocal increase in density of his right MCA, however. He was deemed a candidate for TPA which was administered at [facility] prior to obtaining a CT angiogram of the head and neck, and subsequent transfer to [facility] for further management. 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 NIH stroke score on arrival to [facility] was 12. Formal catheter angiography was performed by Dr. Corliss Skains which showed a complete occlusion of the RT MCA at its origin. Complete TICI 3 revascularization was obtained with 9 mg of IATPA and 2 passes with the Solitaire FR retrieval device. He was admitted to the neuro ICU for further evaluation and treatment.he did well and was extubated and showed improve,ment in his left sided deficits and was discharged home on aspirin and simvastatin for LDL 105. Hypercoagulable panel labs, ANA, ESR and complement were all normal. Telemetry monitoring showed no arrhytmias. He has done well since discharged but reports mild left sided numbness, decreased stamina, memory difficulties, headache and urinary frequency. He has been started on paxil 5 days ago by his primary MD. He takes tramadol 6-8 tablets daily for his headache with partial relief. He has no prior migraine history and  describes his headaches as moderate , occipital and not accompanied by light or sound sensitivity. 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  PMH:  Past Medical History  Diagnosis Date  . Stroke     Social History:  History   Social History  . Marital Status: Single    Spouse Name: N/A    Number of Children: N/A  . Years of Education: N/A   Occupational History  . Not on file.   Social History Main Topics  . Smoking status: Never Smoker   . Smokeless tobacco: Not on file  . Alcohol Use: Yes  . Drug Use: Yes    Special: Cocaine, Marijuana  . Sexual Activity: Not on file   Other Topics Concern  . Not on file   Social History Narrative  . No narrative on file    Medications:   Current Outpatient Prescriptions on File Prior to Visit  Medication Sig Dispense Refill  . aspirin EC 325 MG EC tablet Take 1 tablet (325 mg total) by mouth daily.  30 tablet  0  . PARoxetine (PAXIL) 10 MG tablet Take 1 tablet (10 mg total) by mouth daily.  30 tablet  1  . simvastatin (ZOCOR) 20 MG tablet Take 1 tablet (20 mg total) by mouth daily at 6 PM.  30 tablet  2  . traMADol (ULTRAM) 50 MG tablet Take 2 tablets (100 mg total) by mouth every 6 (six) hours as needed for moderate pain or severe pain.  100 tablet  0   No current facility-administered medications on file prior to visit.    Allergies:  No Known Allergies  Physical Exam General: well developed, well nourished, seated, in no evident distress Head: head normocephalic and atraumatic. Orohparynx benign Neck: supple with no carotid or supraclavicular bruits Cardiovascular: regular rate and rhythm, no murmurs Musculoskeletal: no deformity Skin:  no rash/petichiae Vascular:   Normal pulses all extremities Filed Vitals:   03/28/13 1619  BP: 148/99  Pulse: 64    Neurologic Exam Mental Status: Awake and fully alert. Oriented to place and time. Recent and remote memory intact. Attention span, concentration and fund of knowledge appropriate. Mood and affect appropriate.  Cranial Nerves: Fundoscopic exam reveals sharp disc margins. Pupils equal, briskly reactive to light. Extraocular movements full without nystagmus. Visual fields full to confrontation. Hearing intact. Facial sensation intact. Face, tongue, palate moves normally and symmetrically.  Motor: Normal bulk and tone. Normal strength in all tested extremity muscles.diminished fine finger movements on left Sensory.: diminished  touch and pinprick and vibratory sensation on left hemibody.  Coordination: Rapid alternating movements normal in all extremities. Finger-to-nose and heel-to-shin performed accurately bilaterally. Gait and Station: Arises from chair without difficulty. Stance is normal. Gait demonstrates normal stride length and balance . Able to heel, toe and tandem walk without difficulty.  Reflexes: 1+ and symmetric. Toes downgoing.   NIHSS  1 Modified Rankin  2   ASSESSMENT: 60 year Caucasian male with embolic right MCA branch infarct in November 2014 of cryptogenic etiology treated with  Iv TPA followed by endovascular intervention with mechanical embolectomy using Solitaire device and 9 mg IA TPA with complete revascularization and excelllent clinical recovery. No significant risk factors except borderline hyperlipidimia alone. New onset daily headaches likely tension headaches with analgesic rebound.    PLAN: Continue aspirin for stroke prevention and  Simvastatin for elevated lipids with LDL goal below 100 mg%he may return to work without restrictions but increase exercise gradually as tolerated. Stop tramadol due to analgesic rebound and start Depakote ER 500 mg daily for tension headache.Check  TCD Bubble study today for PFO. Return for f/u with Heide Guile, NP       [facility] [address] [place]. [place] [address]. [phone]       TRANSCRANIAL DOPPLER BUBBLE STUDY   [name] Date of Birth:  02/03/1986 Medical Record Number:  [id number]   Indications: Diagnostic Date of Procedure: 03/28/13 Clinical History:  27 year patient with stroke Technical Description:   Transcranial Doppler Bubble Study was performed at the bedside after taking written informed consent from the patient and explaining risk/benefits. Both middle cerebral arteries were insonated using a headset. And IV line was inserted in the left forearm by the RN using aseptic precautions. Agitated saline injection at rest and after valsalva maneuver did not result in high intensity transient signals (HITS).   Impression:  Negative Transcranial Doppler Bubble Study indicative/not indicative of right to left intracardiac shunt.   Results were explained to the patient. Questions were answered.

## 2013-03-31 DIAGNOSIS — Z0289 Encounter for other administrative examinations: Secondary | ICD-10-CM

## 2013-04-04 ENCOUNTER — Other Ambulatory Visit: Payer: Self-pay | Admitting: Family

## 2013-04-07 ENCOUNTER — Other Ambulatory Visit: Payer: Self-pay | Admitting: Family

## 2013-04-09 ENCOUNTER — Other Ambulatory Visit: Payer: Self-pay | Admitting: Family

## 2013-04-09 MED ORDER — TRAMADOL HCL 50 MG PO TABS: 50.0000 mg | ORAL_TABLET | Freq: Four times a day (QID) | ORAL | Status: DC | PRN

## 2013-04-16 ENCOUNTER — Ambulatory Visit: Payer: BC Managed Care – PPO | Admitting: Family

## 2013-04-17 ENCOUNTER — Telehealth: Payer: Self-pay

## 2013-04-17 NOTE — Telephone Encounter (Signed)
Called patient. Forms are complete and ready for pick up any time tomorrow in the a.m. after they are copied for our files.

## 2013-04-21 ENCOUNTER — Ambulatory Visit (INDEPENDENT_AMBULATORY_CARE_PROVIDER_SITE_OTHER): Payer: BC Managed Care – PPO | Admitting: Family

## 2013-04-21 ENCOUNTER — Encounter: Payer: Self-pay | Admitting: Family

## 2013-04-21 VITALS — BP 142/86 | Temp 98.9°F | Wt 196.0 lb

## 2013-04-21 DIAGNOSIS — I6350 Cerebral infarction due to unspecified occlusion or stenosis of unspecified cerebral artery: Secondary | ICD-10-CM

## 2013-04-21 DIAGNOSIS — R51 Headache: Secondary | ICD-10-CM

## 2013-04-21 DIAGNOSIS — I639 Cerebral infarction, unspecified: Secondary | ICD-10-CM

## 2013-04-21 DIAGNOSIS — F411 Generalized anxiety disorder: Secondary | ICD-10-CM

## 2013-04-21 MED ORDER — PAROXETINE HCL 20 MG PO TABS: 20.0000 mg | ORAL_TABLET | Freq: Every day | ORAL | Status: DC

## 2013-04-21 NOTE — Progress Notes (Signed)
Subjective:    Patient ID: Marcus Hughes, male    DOB: October 10, 1985, 28 y.o.   MRN: [id number]  HPI 28 year old [place], presents today as a followup, CVA that occurred in November of 2014. He's under the care of neurology. Had been having headaches that have improved. We initiated Paxil 10 mg once daily to help with anxiety and still and on age. He felt that the medication has helped some but not as much as he expected for her to work. He has never taken any medication in the past for anxiety or stress. Reports overall he does feel better with a better job, better pay, and being able to work out again.   Review of Systems  Constitutional: Negative.   HENT: Negative for facial swelling, nosebleeds and sinus pressure.   Respiratory: Negative.   Cardiovascular: Negative.   Genitourinary: Negative.   Musculoskeletal: Negative.   Neurological: Positive for headaches.       Improved  Psychiatric/Behavioral: Positive for suicidal ideas, decreased concentration and agitation. Negative for hallucinations. The patient is nervous/anxious.    Past Medical History  Diagnosis Date  . Stroke     History   Social History  . Marital Status: Single    Spouse Name: N/A    Number of Children: N/A  . Years of Education: N/A   Occupational History  . Not on file.   Social History Main Topics  . Smoking status: Never Smoker   . Smokeless tobacco: Not on file  . Alcohol Use: Yes  . Drug Use: Yes    Special: Cocaine, Marijuana  . Sexual Activity: Not on file   Other Topics Concern  . Not on file   Social History Narrative  . No narrative on file    Past Surgical History  Procedure Laterality Date  . Radiology with anesthesia N/A 02/23/2013    Procedure: RADIOLOGY WITH ANESTHESIA;  Surgeon: Oneal GroutSanjeev K Deveshwar, MD;  Location: [facility] OR;  Service: Radiology;  Laterality: N/A;  . Tee without cardioversion N/A 02/26/2013    Procedure: TRANSESOPHAGEAL ECHOCARDIOGRAM (TEE);   Surgeon: Vesta MixerPhilip J Nahser, MD;  [facility];  Service: Cardiovascular;  Laterality: N/A;  . Hernia repair    . Rotator cuff repair      No family history on file.  No Known Allergies  Current Outpatient Prescriptions on File Prior to Visit  Medication Sig Dispense Refill  . aspirin EC 325 MG EC tablet Take 1 tablet (325 mg total) by mouth daily.  30 tablet  0  . simvastatin (ZOCOR) 20 MG tablet Take 1 tablet (20 mg total) by mouth daily at 6 PM.  30 tablet  2  . traMADol (ULTRAM) 50 MG tablet Take 1 tablet (50 mg total) by mouth every 6 (six) hours as needed.  50 tablet  0  . divalproex (DEPAKOTE ER) 500 MG 24 hr tablet Take 1 tablet (500 mg total) by mouth daily.  30 tablet  3   No current facility-administered medications on file prior to visit.    BP 142/86  Temp(Src) 98.9 F (37.2 C) (Oral)  Wt 196 lb (88.905 kg)chart    Objective:   Physical Exam  Constitutional: He is oriented to person, place, and time. He appears well-developed and well-nourished.  Neck: Normal range of motion. Neck supple. No thyromegaly present.  Cardiovascular: Normal rate, regular rhythm and normal heart sounds.   Pulmonary/Chest: Effort normal and breath sounds normal.  Musculoskeletal: Normal range of motion.  Neurological: He  is alert and oriented to person, place, and time. He has normal reflexes. No cranial nerve deficit. Coordination normal.  Skin: Skin is warm and dry.  Psychiatric: He has a normal mood and affect.          Assessment & Plan:  Assessment: 1. Anxiety 2. Acute embolic stroke  Plan: Increase Paxil to 20 mg once daily. Continue a healthy diet and exercise. Continue current medications. The office if symptoms worsen or persist. Recheck in 4 weeks or sooner as needed.

## 2013-04-21 NOTE — Patient Instructions (Signed)

## 2013-04-22 ENCOUNTER — Encounter: Payer: Self-pay | Admitting: Family

## 2013-04-28 ENCOUNTER — Encounter: Payer: Self-pay | Admitting: Family

## 2013-05-23 ENCOUNTER — Ambulatory Visit: Payer: BC Managed Care – PPO | Admitting: Family

## 2013-05-30 ENCOUNTER — Ambulatory Visit: Payer: BC Managed Care – PPO | Admitting: Family

## 2013-05-30 DIAGNOSIS — Z0289 Encounter for other administrative examinations: Secondary | ICD-10-CM

## 2013-06-27 ENCOUNTER — Ambulatory Visit: Payer: BC Managed Care – PPO | Admitting: Nurse Practitioner

## 2013-06-27 ENCOUNTER — Telehealth: Payer: Self-pay | Admitting: Nurse Practitioner

## 2013-06-27 NOTE — Telephone Encounter (Signed)
Patient was no show for today's office appointment.  

## 2013-09-04 ENCOUNTER — Telehealth: Payer: Self-pay | Admitting: Family

## 2013-09-04 NOTE — Telephone Encounter (Signed)
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

## 2013-09-04 NOTE — Telephone Encounter (Signed)
Spoke with pt to notify him that Rx cannot be refilled until he is seen in the office due to him no having been seen since January and he also has 2 cancellations and 1 No Show. Offered appointment for tomorrow 09/05/13 at 2 pm.  Pt will come in at that time

## 2013-09-05 ENCOUNTER — Ambulatory Visit (INDEPENDENT_AMBULATORY_CARE_PROVIDER_SITE_OTHER): Payer: BC Managed Care – PPO | Admitting: Family

## 2013-09-05 ENCOUNTER — Encounter: Payer: Self-pay | Admitting: Family

## 2013-09-05 VITALS — BP 120/88 | HR 77 | Wt 183.0 lb

## 2013-09-05 DIAGNOSIS — F411 Generalized anxiety disorder: Secondary | ICD-10-CM

## 2013-09-05 DIAGNOSIS — R51 Headache: Secondary | ICD-10-CM

## 2013-09-05 DIAGNOSIS — E7800 Pure hypercholesterolemia, unspecified: Secondary | ICD-10-CM

## 2013-09-05 MED ORDER — SIMVASTATIN 20 MG PO TABS: 20.0000 mg | ORAL_TABLET | Freq: Every day | ORAL | Status: DC

## 2013-09-05 MED ORDER — TRAMADOL HCL 50 MG PO TABS: 50.0000 mg | ORAL_TABLET | Freq: Four times a day (QID) | ORAL | Status: DC | PRN

## 2013-09-05 MED ORDER — DIVALPROEX SODIUM ER 500 MG PO TB24: 500.0000 mg | ORAL_TABLET | Freq: Every day | ORAL | Status: DC

## 2013-09-05 MED ORDER — PAROXETINE HCL 20 MG PO TABS: 20.0000 mg | ORAL_TABLET | Freq: Every day | ORAL | Status: DC

## 2013-09-05 NOTE — Patient Instructions (Addendum)
See Judithe Modest   Generalized Anxiety Disorder Generalized anxiety disorder (GAD) is a mental disorder. It interferes with life functions, including relationships, work, and school. GAD is different from normal anxiety, which everyone experiences at some point in their lives in response to specific life events and activities. Normal anxiety actually helps [place] prepare for and get through these life events and activities. Normal anxiety goes away after the event or activity is over.  GAD causes anxiety that is not necessarily related to specific events or activities. It also causes excess anxiety in proportion to specific events or activities. The anxiety associated with GAD is also difficult to control. GAD can vary from mild to severe. People with severe GAD can have intense waves of anxiety with physical symptoms (panic attacks).  SYMPTOMS The anxiety and worry associated with GAD are difficult to control. This anxiety and worry are related to many life events and activities and also occur more days than not for 6 months or longer. People with GAD also have three or more of the following symptoms (one or more in children):  Restlessness.   Fatigue.  Difficulty concentrating.   Irritability.  Muscle tension.  Difficulty sleeping or unsatisfying sleep. DIAGNOSIS GAD is diagnosed through an assessment by your caregiver. Your caregiver will ask you questions aboutyour mood,physical symptoms, and events in your life. Your caregiver may ask you about your medical history and use of alcohol or drugs, including prescription medications. Your caregiver may also do a physical exam and blood tests. Certain medical conditions and the use of certain substances can cause symptoms similar to those associated with GAD. Your caregiver may refer you to a mental health specialist for further evaluation. TREATMENT The following therapies are usually used to treat GAD:   Medication Antidepressant medication  usually is prescribed for long-term daily control. Antianxiety medications may be added in severe cases, especially when panic attacks occur.   Talk therapy (psychotherapy) Certain types of talk therapy can be helpful in treating GAD by providing support, education, and guidance. A form of talk therapy called cognitive behavioral therapy can teach you healthy ways to think about and react to daily life events and activities.  Stress managementtechniques These include yoga, meditation, and exercise and can be very helpful when they are practiced regularly. A mental health specialist can help determine which treatment is best for you. Some people see improvement with one therapy. However, other people require a combination of therapies. Document Released: 07/22/2012 Document Reviewed: 07/22/2012 [facility] Patient Information 2014 [place], [place].

## 2013-09-05 NOTE — Progress Notes (Signed)
Subjective:    Patient ID: Marcus Hughes, male    DOB: July 25, 1985, 28 y.o.   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 He also has a history of anxiety, hyperlipidemia, marijuana abuse. He's been off of all medications for several months due to financial crisis. Report increased anxiety since being off Paxil. Has concerns of inability to really see motions. Recently broke up with his fiance.   Review of Systems  Constitutional: Negative.   HENT: Negative.   Respiratory: Negative.   Cardiovascular: Negative.   Gastrointestinal: Negative.   Endocrine: Negative.   Genitourinary: Negative.   Musculoskeletal: Negative.   Skin: Negative.   Neurological: Negative.   Psychiatric/Behavioral: Positive for decreased concentration. The patient is nervous/anxious.    Past Medical History  Diagnosis Date  . Stroke     History   Social History  . Marital Status: Single    Spouse Name: N/A    Number of Children: N/A  . Years of Education: N/A   Occupational History  . Not on file.   Social History Main Topics  . Smoking status: Never Smoker   . Smokeless tobacco: Not on file  . Alcohol Use: Yes  . Drug Use: Yes    Special: Cocaine, Marijuana  . Sexual Activity: Not on file   Other Topics Concern  . Not on file   Social History Narrative  . No narrative on file    Past Surgical History  Procedure Laterality Date  . Radiology with anesthesia N/A 02/23/2013    Procedure: RADIOLOGY WITH ANESTHESIA;  Surgeon: Oneal Grout, MD;  Location: [facility] OR;  Service: Radiology;  Laterality: N/A;  . Tee without cardioversion N/A 02/26/2013    Procedure: TRANSESOPHAGEAL ECHOCARDIOGRAM (TEE);  Surgeon: Vesta Mixer, MD;  [facility]: [facility] ENDOSCOPY;  Service: Cardiovascular;  Laterality: N/A;  . Hernia repair    . Rotator cuff repair      No family history on file.  No Known Allergies  Current  Outpatient Prescriptions on File Prior to Visit  Medication Sig Dispense Refill  . aspirin EC 325 MG EC tablet Take 1 tablet (325 mg total) by mouth daily.  30 tablet  0   No current facility-administered medications on file prior to visit.    BP 120/88  Pulse 77  Wt 183 lb (83.008 kg)  SpO2 98%chart    Objective:   Physical Exam  Constitutional: He is oriented to person, place, and time. He appears well-developed and well-nourished.  HENT:  Right Ear: External ear normal.  Left Ear: External ear normal.  Nose: Nose normal.  Mouth/Throat: Oropharynx is clear and moist.  Neck: Normal range of motion. Neck supple.  Cardiovascular: Normal rate, regular rhythm and normal heart sounds.   Pulmonary/Chest: Effort normal and breath sounds normal.  Musculoskeletal: Normal range of motion.  Neurological: He is alert and oriented to person, place, and time.  Skin: Skin is warm and dry.  Psychiatric: He has a normal mood and affect.          Assessment & Plan:  Marcus Hughes was seen today for follow-up.  Diagnoses and associated orders for this visit:  Headache(784.0) - Discontinue: divalproex (DEPAKOTE ER) 500 MG 24 hr tablet; Take 1 tablet (500 mg total) by mouth daily. - divalproex (DEPAKOTE ER) 500 MG 24 hr tablet; Take 1 tablet (500 mg total) by mouth daily.  Generalized anxiety disorder  Pure hypercholesterolemia  Other Orders - Discontinue: simvastatin (ZOCOR) 20 MG tablet; Take 1 tablet (20 mg total) by mouth daily at 6 PM. - Discontinue: PARoxetine (PAXIL) 20 MG tablet; Take 1 tablet (20 mg total) by mouth daily. - simvastatin (ZOCOR) 20 MG tablet; Take 1 tablet (20 mg total) by mouth daily at 6 PM. - PARoxetine (PAXIL) 20 MG tablet; Take 1 tablet (20 mg total) by mouth daily. - traMADol (ULTRAM) 50 MG tablet; Take 1 tablet (50 mg total) by mouth every 6 (six) hours as needed.   Call the office with any questions or concerns. Recheck in 3 weeks and sooner as needed.  Contact Judithe ModestSusan Bond for psychotherapy.

## 2013-09-05 NOTE — Progress Notes (Signed)
Pre visit review using our clinic review tool, if applicable. No additional management support is needed unless otherwise documented below in the visit note. 

## 2013-09-12 ENCOUNTER — Encounter: Payer: Self-pay | Admitting: Family

## 2013-09-30 ENCOUNTER — Ambulatory Visit: Payer: BC Managed Care – PPO | Admitting: Family

## 2013-10-17 ENCOUNTER — Other Ambulatory Visit: Payer: Self-pay

## 2013-10-17 DIAGNOSIS — R51 Headache: Secondary | ICD-10-CM

## 2013-10-17 MED ORDER — DIVALPROEX SODIUM ER 500 MG PO TB24: 500.0000 mg | ORAL_TABLET | Freq: Every day | ORAL | Status: DC

## 2013-10-17 MED ORDER — SIMVASTATIN 20 MG PO TABS: 20.0000 mg | ORAL_TABLET | Freq: Every day | ORAL | Status: DC

## 2013-10-17 MED ORDER — PAROXETINE HCL 20 MG PO TABS: 20.0000 mg | ORAL_TABLET | Freq: Every day | ORAL | Status: DC

## 2013-12-03 ENCOUNTER — Telehealth: Payer: Self-pay | Admitting: Family

## 2013-12-03 NOTE — Telephone Encounter (Signed)
I tried to schedule pt for a 15 minute med fup visit and the system would not allow me. System said 15 minutes not allowed for this patient.  Is there a reason for this?

## 2013-12-05 NOTE — Telephone Encounter (Signed)
Called and left message for pt to call back and schedule an appt

## 2014-02-03 ENCOUNTER — Other Ambulatory Visit: Payer: Self-pay | Admitting: Family

## 2014-02-17 ENCOUNTER — Other Ambulatory Visit: Payer: Self-pay | Admitting: Family

## 2014-03-18 ENCOUNTER — Telehealth: Payer: Self-pay | Admitting: Family

## 2014-03-18 NOTE — Telephone Encounter (Signed)
Needs an appointment.

## 2014-03-18 NOTE — Telephone Encounter (Signed)
Pt called to say that he [facility] that [facility] has no [facility]. He has send the [facility] doctors and they have cleared. Pt is asking if he need an appt or if the letter can be written from his last visit.

## 2014-03-19 NOTE — Telephone Encounter (Signed)
Yes ma'am! 

## 2014-03-19 NOTE — Telephone Encounter (Signed)
Pt scheduled  

## 2014-03-19 NOTE — Telephone Encounter (Signed)
May I put  30 minute appt together

## 2014-03-31 ENCOUNTER — Ambulatory Visit (INDEPENDENT_AMBULATORY_CARE_PROVIDER_SITE_OTHER): Admitting: Family

## 2014-03-31 ENCOUNTER — Encounter: Payer: Self-pay | Admitting: Family

## 2014-03-31 VITALS — BP 110/62 | HR 68 | Wt 205.2 lb

## 2014-03-31 DIAGNOSIS — R519 Headache, unspecified: Secondary | ICD-10-CM

## 2014-03-31 DIAGNOSIS — Z23 Encounter for immunization: Secondary | ICD-10-CM

## 2014-03-31 DIAGNOSIS — E7800 Pure hypercholesterolemia, unspecified: Secondary | ICD-10-CM

## 2014-03-31 DIAGNOSIS — R51 Headache: Secondary | ICD-10-CM

## 2014-03-31 DIAGNOSIS — F411 Generalized anxiety disorder: Secondary | ICD-10-CM

## 2014-03-31 LAB — HEPATIC FUNCTION PANEL
ALK PHOS: 31 U/L — AB (ref 39–117)
ALT: 23 U/L (ref 0–53)
AST: 32 U/L (ref 0–37)
Albumin: 4.5 g/dL (ref 3.5–5.2)
Bilirubin, Direct: 0 mg/dL (ref 0.0–0.3)
TOTAL PROTEIN: 7.6 g/dL (ref 6.0–8.3)
Total Bilirubin: 0.7 mg/dL (ref 0.2–1.2)

## 2014-03-31 LAB — BASIC METABOLIC PANEL
BUN: 13 mg/dL (ref 6–23)
CHLORIDE: 103 meq/L (ref 96–112)
CO2: 28 meq/L (ref 19–32)
Calcium: 9.5 mg/dL (ref 8.4–10.5)
Creatinine, Ser: 1.2 mg/dL (ref 0.4–1.5)
GFR: 93.27 mL/min (ref >60.00)
GLUCOSE: 102 mg/dL — AB (ref 70–99)
POTASSIUM: 4.1 meq/L (ref 3.5–5.1)
SODIUM: 138 meq/L (ref 135–145)

## 2014-03-31 LAB — LIPID PANEL
CHOLESTEROL: 199 mg/dL (ref 0–200)
HDL: 52.8 mg/dL (ref >39.00)
LDL Cholesterol: 124 mg/dL — ABNORMAL HIGH (ref 0–99)
NonHDL: 146.2
Total CHOL/HDL Ratio: 4
Triglycerides: 112 mg/dL (ref 0.0–149.0)
VLDL: 22.4 mg/dL (ref 0.0–40.0)

## 2014-03-31 MED ORDER — SIMVASTATIN 20 MG PO TABS: 20.0000 mg | ORAL_TABLET | Freq: Every day | ORAL | Status: DC

## 2014-03-31 MED ORDER — PAROXETINE HCL 20 MG PO TABS: 20.0000 mg | ORAL_TABLET | Freq: Every day | ORAL | Status: DC

## 2014-03-31 MED ORDER — DIVALPROEX SODIUM ER 500 MG PO TB24: 500.0000 mg | ORAL_TABLET | Freq: Every day | ORAL | Status: DC

## 2014-03-31 NOTE — Progress Notes (Signed)
Pre visit review using our clinic review tool, if applicable. No additional management support is needed unless otherwise documented below in the visit note. 

## 2014-03-31 NOTE — Patient Instructions (Signed)
Stroke Prevention Some medical conditions and behaviors are associated with an increased chance of having a stroke. You may prevent a stroke by making healthy choices and managing medical conditions. HOW CAN I REDUCE MY RISK OF HAVING A STROKE?   Stay physically active. Get at least 30 minutes of activity on most or all days.  Do not smoke. It may also be helpful to avoid exposure to secondhand smoke.  Limit alcohol use. Moderate alcohol use is considered to be:  No more than 2 drinks per day for men.  No more than 1 drink per day for nonpregnant women.  Eat healthy foods. This involves:  Eating 5 or more servings of fruits and vegetables a day.  Making dietary changes that address high blood pressure (hypertension), high cholesterol, diabetes, or obesity.  Manage your cholesterol levels.  Making food choices that are high in fiber and low in saturated fat, trans fat, and cholesterol may control cholesterol levels.  Take any prescribed medicines to control cholesterol as directed by your health care provider.  Manage your diabetes.  Controlling your carbohydrate and sugar intake is recommended to manage diabetes.  Take any prescribed medicines to control diabetes as directed by your health care provider.  Control your hypertension.  Making food choices that are low in salt (sodium), saturated fat, trans fat, and cholesterol is recommended to manage hypertension.  Take any prescribed medicines to control hypertension as directed by your health care provider.  Maintain a healthy weight.  Reducing calorie intake and making food choices that are low in sodium, saturated fat, trans fat, and cholesterol are recommended to manage weight.  Stop drug abuse.  Avoid taking birth control pills.  Talk to your health care provider about the risks of taking birth control pills if you are over 35 years old, smoke, get migraines, or have ever had a blood clot.  Get evaluated for sleep  disorders (sleep apnea).  Talk to your health care provider about getting a sleep evaluation if you snore a lot or have excessive sleepiness.  Take medicines only as directed by your health care provider.  For some people, aspirin or blood thinners (anticoagulants) are helpful in reducing the risk of forming abnormal blood clots that can lead to stroke. If you have the irregular heart rhythm of atrial fibrillation, you should be on a blood thinner unless there is a good reason you cannot take them.  Understand all your medicine instructions.  Make sure that other conditions (such as anemia or atherosclerosis) are addressed. SEEK IMMEDIATE MEDICAL CARE IF:   You have sudden weakness or numbness of the face, arm, or leg, especially on one side of the body.  Your face or eyelid droops to one side.  You have sudden confusion.  You have trouble speaking (aphasia) or understanding.  You have sudden trouble seeing in one or both eyes.  You have sudden trouble walking.  You have dizziness.  You have a loss of balance or coordination.  You have a sudden, severe headache with no known cause.  You have new chest pain or an irregular heartbeat. Any of these symptoms may represent a serious problem that is an emergency. Do not wait to see if the symptoms will go away. Get medical help at once. Call your local emergency services (911 in [place] Do not drive yourself to the hospital. Document Released: 05/04/2004 Document Revised: 08/11/2013 Document Reviewed: 09/27/2012 [employer] Patient Information 2015 [employer]. This information is not intended to replace advice given   to you by your health care provider. Make sure you discuss any questions you have with your health care provider.  

## 2014-03-31 NOTE — Progress Notes (Signed)
Subjective:    Patient ID: Marcus Hughes, male    DOB: 1985/12/26, 28 y.o.   MRN: [id number]  HPI  28 year old African-American male, nonsmoker with a history of embolic stroke, tension headaches, anxiety, hypercholesterolemia is in today for recheck. Reports doing well. His requesting to have a note to return to normal job duties with the [employer]. He is back to his normal self.: This come back in his left arm. Is working out on a regular basis and eating well.  Review of Systems  Constitutional: Negative.   HENT: Negative.   Respiratory: Negative.   Cardiovascular: Negative.   Gastrointestinal: Negative.   Endocrine: Negative.   Genitourinary: Negative.   Musculoskeletal: Negative.   Skin: Negative.   Allergic/Immunologic: Negative.   Neurological: Negative.   Hematological: Negative.   Psychiatric/Behavioral: The patient is nervous/anxious.    Past Medical History  Diagnosis Date  . Stroke     History   Social History  . Marital Status: Single    Spouse Name: N/A    Number of Children: N/A  . Years of Education: N/A   Occupational History  . Not on file.   Social History Main Topics  . Smoking status: Never Smoker   . Smokeless tobacco: Not on file  . Alcohol Use: Yes  . Drug Use: Yes    Special: Cocaine, Marijuana  . Sexual Activity: Not on file   Other Topics Concern  . Not on file   Social History Narrative    Past Surgical History  Procedure Laterality Date  . Radiology with anesthesia N/A 02/23/2013    Procedure: RADIOLOGY WITH ANESTHESIA;  Surgeon: Oneal GroutSanjeev K Deveshwar, MD;  Location: [facility] OR;  Service: Radiology;  Laterality: N/A;  . Tee without cardioversion N/A 02/26/2013    Procedure: TRANSESOPHAGEAL ECHOCARDIOGRAM (TEE);  Surgeon: Vesta MixerPhilip J Nahser, MD;  [facility];  Service: Cardiovascular;  Laterality: N/A;  . Hernia repair    . Rotator cuff repair      History reviewed. No pertinent family history.  No Known  Allergies  Current Outpatient Prescriptions on File Prior to Visit  Medication Sig Dispense Refill  . aspirin EC 325 MG EC tablet Take 1 tablet (325 mg total) by mouth daily. 30 tablet 0  . traMADol (ULTRAM) 50 MG tablet Take 1 tablet (50 mg total) by mouth every 6 (six) hours as needed. 50 tablet 0   No current facility-administered medications on file prior to visit.    BP 110/62 mmHg  Pulse 68  Wt 205 lb 3.2 oz (93.078 kg)chart    Objective:   Physical Exam  Constitutional: He is oriented to person, place, and time. He appears well-developed and well-nourished.  HENT:  Right Ear: External ear normal.  Left Ear: External ear normal.  Nose: Nose normal.  Mouth/Throat: Oropharynx is clear and moist.  Neck: Normal range of motion. Neck supple. No thyromegaly present.  Cardiovascular: Normal rate, regular rhythm and normal heart sounds.   Pulmonary/Chest: Effort normal and breath sounds normal.  Abdominal: Soft. Bowel sounds are normal.  Musculoskeletal: Normal range of motion.  Neurological: He is alert and oriented to person, place, and time.  Skin: Skin is warm and dry.  Psychiatric: He has a normal mood and affect.          Assessment & Plan:  Marcus Hughes was seen today for follow-up.  Diagnoses and associated orders for this visit:  Pure hypercholesterolemia - Lipid Panel - Basic Metabolic Panel - Hepatic Function Panel  Anxiety state  Nonintractable headache, unspecified chronicity pattern, unspecified headache type - divalproex (DEPAKOTE ER) 500 MG 24 hr tablet; Take 1 tablet (500 mg total) by mouth daily. - Valproic Acid Level  Need for prophylactic vaccination with combined diphtheria-tetanus-pertussis (DTP) vaccine - Tdap vaccine greater than or equal to 7yo IM  Other Orders - PARoxetine (PAXIL) 20 MG tablet; Take 1 tablet (20 mg total) by mouth daily. - simvastatin (ZOCOR) 20 MG tablet; Take 1 tablet (20 mg total) by mouth daily at 6 PM.    call the  office with any questions or concerns. Will recheck pending labs, in 3 months, sooner as needed.

## 2014-04-01 LAB — VALPROIC ACID LEVEL: Valproic Acid Lvl: <12.5 ug/mL — ABNORMAL LOW (ref 50.0–100.0)

## 2014-07-31 ENCOUNTER — Ambulatory Visit: Admitting: Family

## 2014-09-05 ENCOUNTER — Other Ambulatory Visit: Payer: Self-pay | Admitting: Family

## 2014-12-24 IMAGING — CR DG CHEST 1V PORT
1 series · 1 of 1 positions shown · non-contrast
Comparison: 02/23/2013 at [DATE] p.m.

CLINICAL DATA: Stroke and respiratory failure

EXAM:
PORTABLE CHEST - 1 VIEW

[AP]
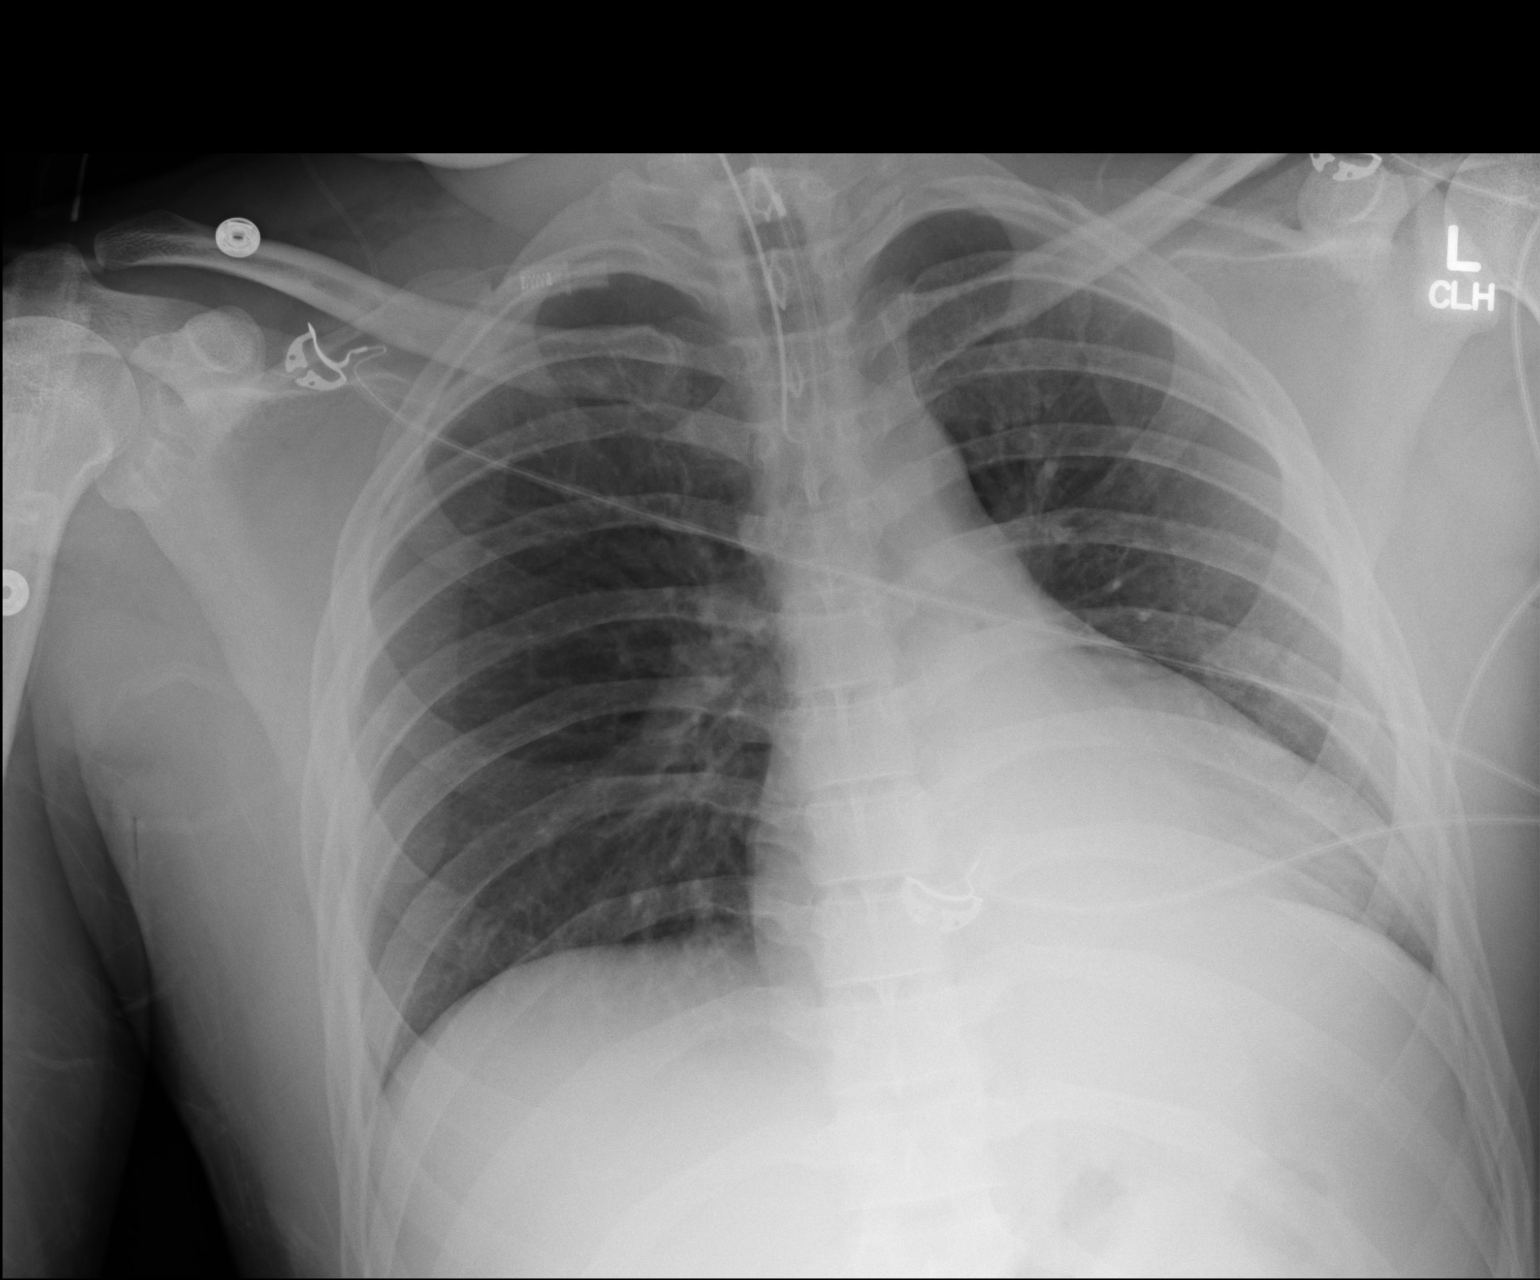

[1 of 1 positions shown; findings below may reference images not displayed]

FINDINGS: Endotracheal tube ends in the lower thoracic trachea, approximately
3 cm above the carina.

Heart size within normal limits for technique. New retrocardiac
opacity, obscuring the diaphragm, with subtle left-sided volume
loss. No edema, effusion, or pneumothorax.

Evidence of prior surgery to the right anterior glenoid.
IMPRESSION: 1. Endotracheal tube in good position.
2. Left lower lobe atelectasis or collapse.

## 2014-12-24 IMAGING — CT CT CERVICAL SPINE W/O CM
4 of 5 series · 14 of 33 positions shown, 16 images · non-contrast
Comparison: None.

CLINICAL DATA: Post fall, now with altered level of consciousness
and left-sided weakness

EXAM:
CT HEAD WITHOUT CONTRAST
CT CERVICAL SPINE WITHOUT CONTRAST
TECHNIQUE: Multidetector CT imaging of the head and cervical spine was
performed following the standard protocol without intravenous
contrast. Multiplanar CT image reconstructions of the cervical spine
were also generated.

[Series 5: cervical st 2.0 b31s · axial · 0.29mm/px · z∈[+112,+148]mm · 2 of 91 slices shown]
[im 19/91  bone]
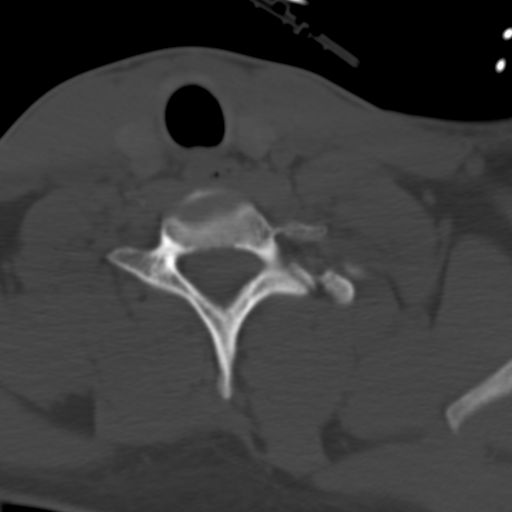
[im 37/91  bone]
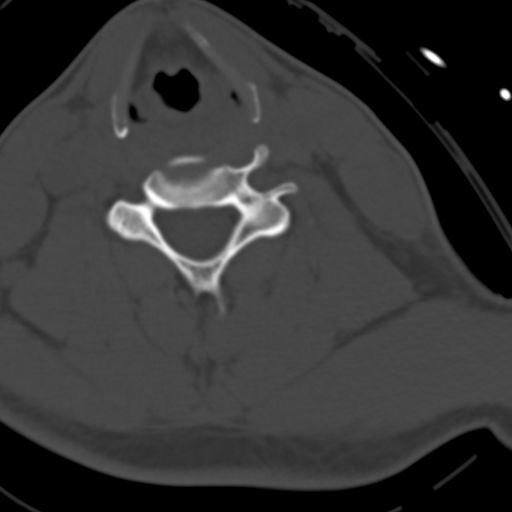

[Series 7: sagittal bone 2.0 · sagittal · 0.29mm/px · 5 of 60 slices shown, 6 images]
[im 20/60  bone]
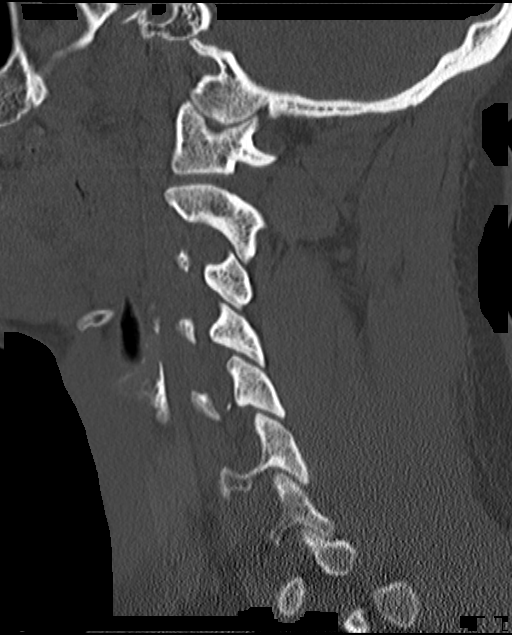
[im 25/60  bone]
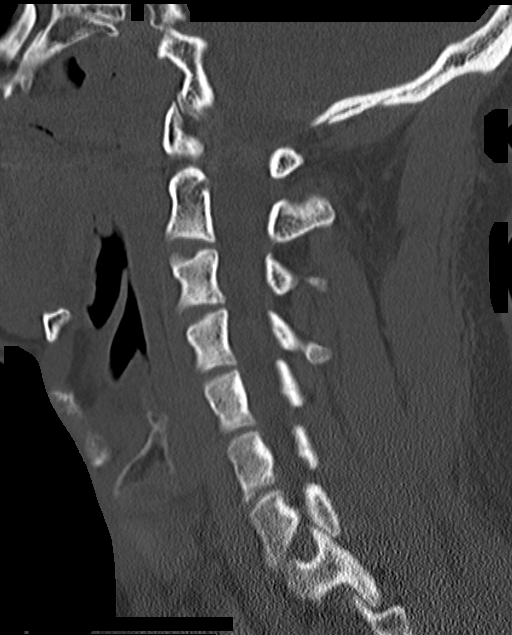
[im 30/60  soft-tissue]
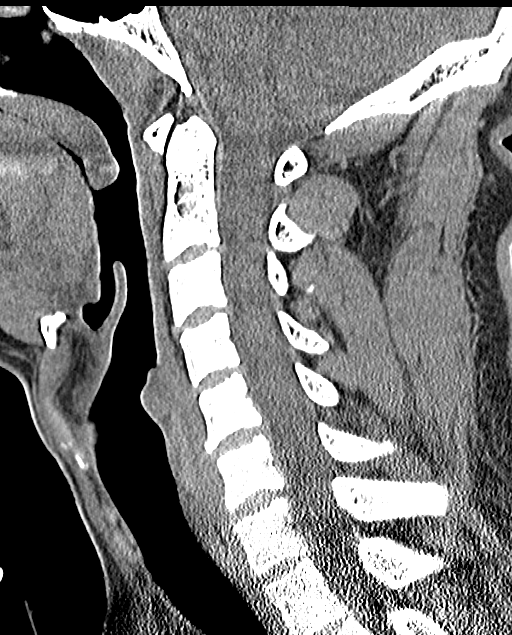
[im 30/60  bone]
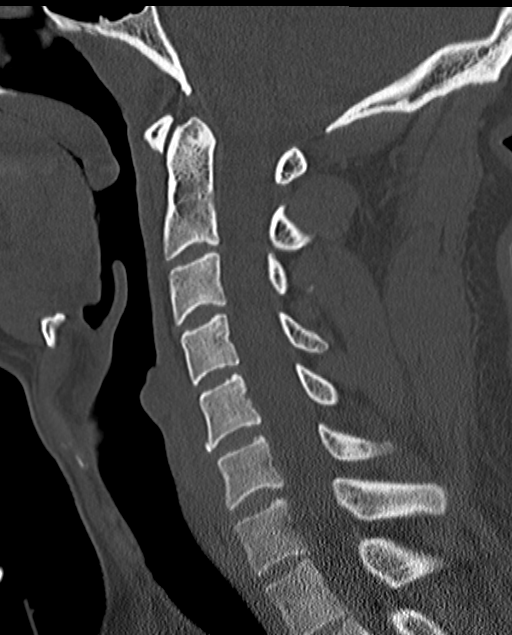
[im 35/60  bone]
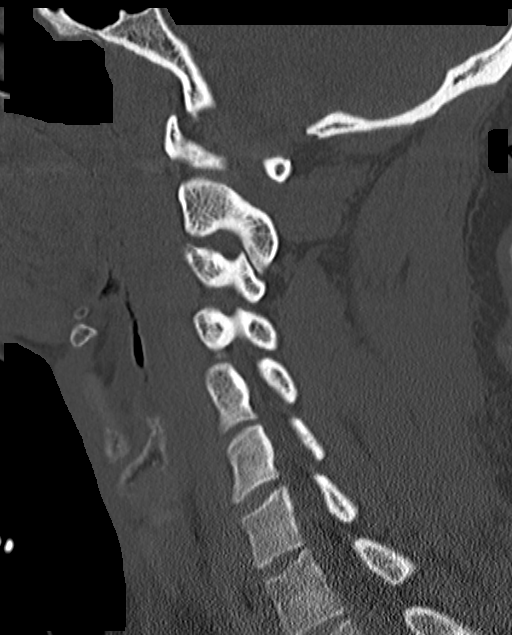
[im 40/60  bone]
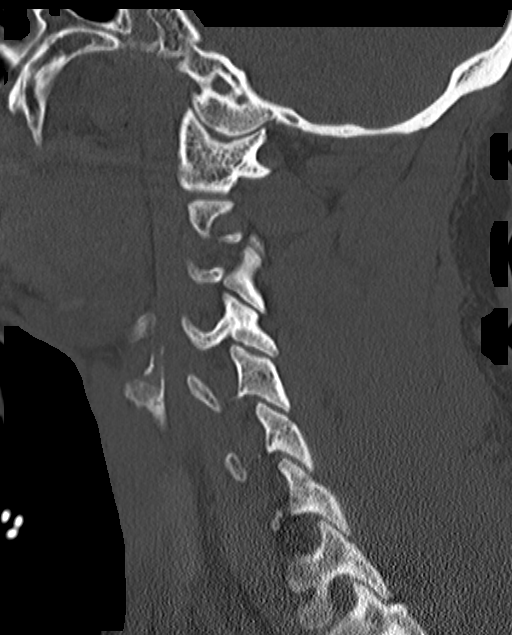

[Series 8: coronal bone 2.0 · coronal · 0.37mm/px · 3 of 53 slices shown]
[im 11/53  bone]
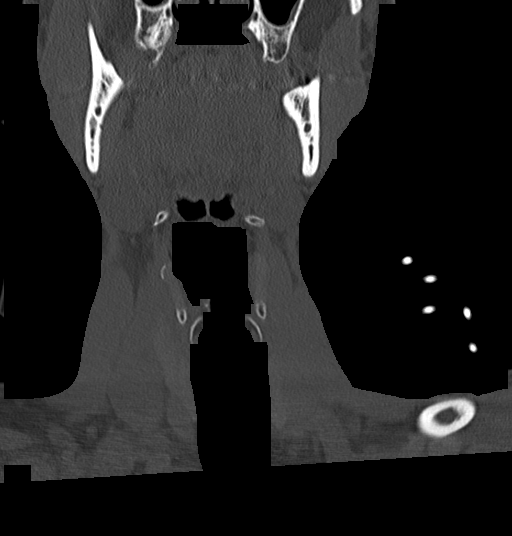
[im 21/53  bone]
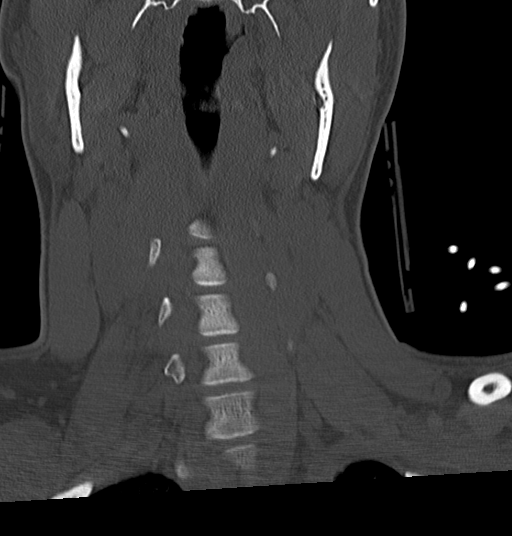
[im 32/53  bone]
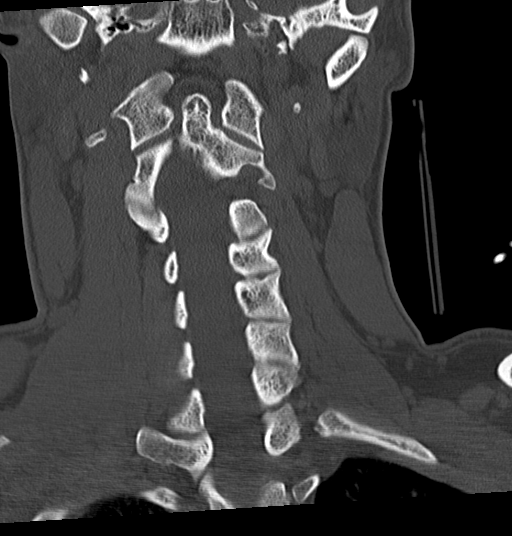

[Series 9: axial bone 2.0 · axial · 0.22mm/px · z∈[+81,+190]mm · 4 of 96 slices shown, 5 images]
[im 20/96  soft-tissue]
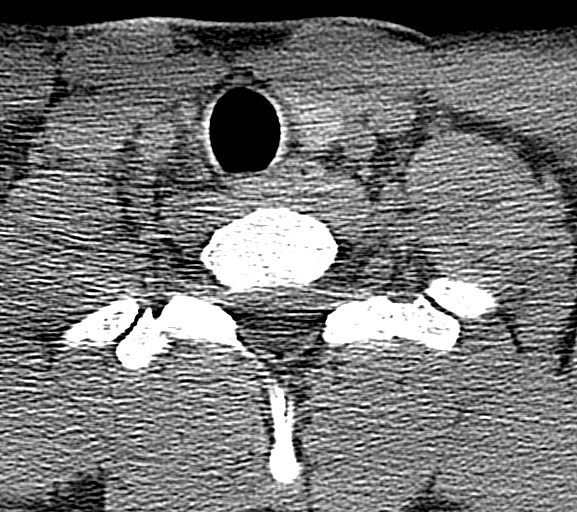
[im 20/96  bone]
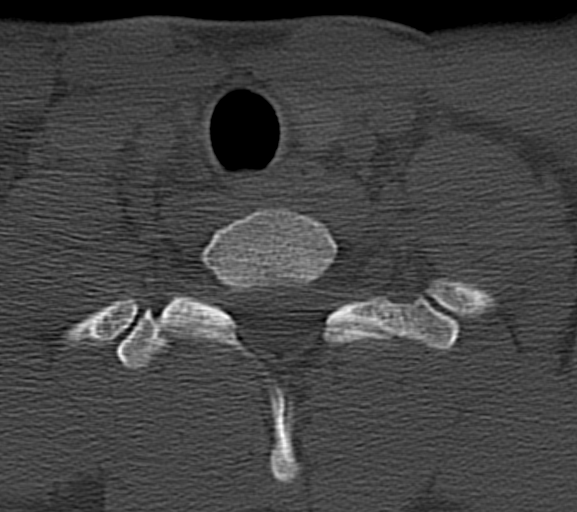
[im 39/96  bone]
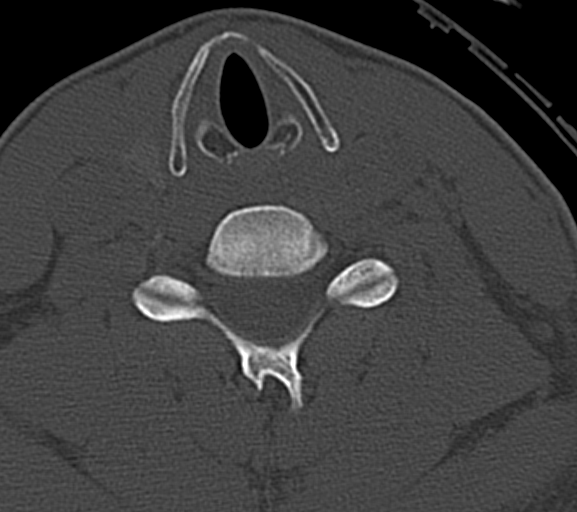
[im 58/96  bone]
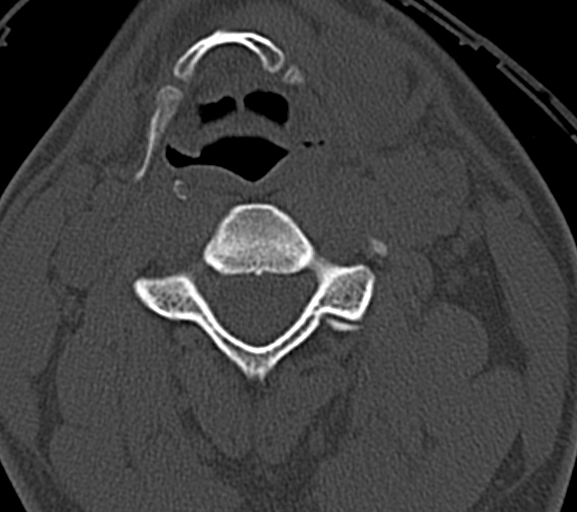
[im 77/96  bone]
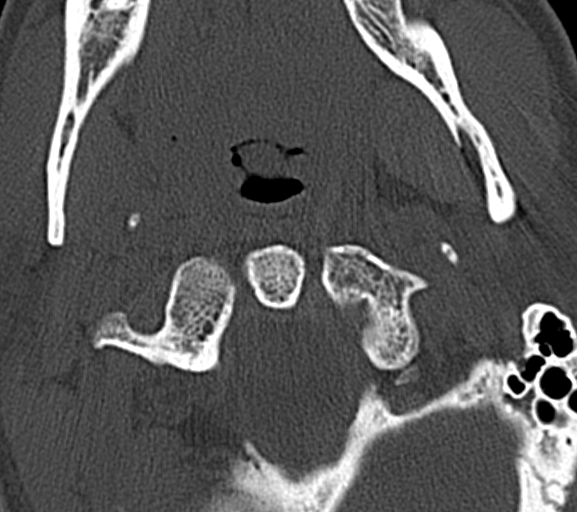

[14 of 33 positions shown; findings below may reference images not displayed]

FINDINGS: CT HEAD FINDINGS

The examination is minimally degraded due to patient's head being
held in a minimal degree of obliquity.

There is apparent asymmetric increased attenuation of the right MCA.
This finding is without associated discrete blurring of the
right-side gray-white differentiation although there does appear to
be minimal (approximately 3 mm) of right to left midline shift
(image 14, series 2) which may suggest mild diffuse right-sided
cerebral edema.

No intraparenchymal or extra-axial mass or hemorrhage. There is very
minimal effacement of the right lateral ventricle with otherwise
normal size and configuration of the ventricles and basilar
cisterns.

Limited visualization of the paranasal sinuses demonstrates polypoid
mucosal thickening involving the right posterior ethmoidal air
cells. No air-fluid levels. Regional soft tissues are normal. No
displaced calvarial fracture.

CT CERVICAL SPINE FINDINGS

C1 to the superior endplate of T1 is image.

Normal alignment of the cervical spine. No anterolisthesis or
retrolisthesis. The bilateral facets are normally aligned. The dens
is normally positioned between the lateral mass of C1. Normal
atlantodental and atlantoaxial articulations.

No fracture or static subluxation of the cervical spine. Cervical
vertebral body heights are preserved. Prevertebral soft tissues are
normal.

Intervertebral disc spaces are preserved.

Scattered shoddy bilateral cervical lymph nodes are individually not
enlarged by size criteria. No bulky cervical lymphadenopathy on this
noncontrast examination. Normal noncontrast appearance of the
thyroid gland. Normal appearance of the visualized bilateral lung
apices.
IMPRESSION: 1. Suspected right-sided hyperdense MCA sign with findings
suggestive of mild diffuse right-sided cerebral edema with
associated minimal (approximately 3 mm) of right to left midline
shift. While there is relative preservation of the gray-white
junction, these findings are worrisome for an early right MCA
distribution infarct. Further evaluation with MRI may be performed
as clinically indicated.
2. No fracture or static subluxation of the cervical spine.

Critical Value/emergent results were called by telephone at the time
of interpretation on 02/23/2013 at [DATE] to Dr.CZESIEK MARKINK ,
who verbally acknowledged these results.

## 2017-07-11 ENCOUNTER — Ambulatory Visit (INDEPENDENT_AMBULATORY_CARE_PROVIDER_SITE_OTHER): Admitting: Adult Health

## 2017-07-11 ENCOUNTER — Encounter: Payer: Self-pay | Admitting: Adult Health

## 2017-07-11 VITALS — BP 100/70 | Temp 98.5°F | Wt 200.0 lb

## 2017-07-11 DIAGNOSIS — Z0000 Encounter for general adult medical examination without abnormal findings: Secondary | ICD-10-CM | POA: Diagnosis not present

## 2017-07-11 DIAGNOSIS — I63511 Cerebral infarction due to unspecified occlusion or stenosis of right middle cerebral artery: Secondary | ICD-10-CM | POA: Diagnosis not present

## 2017-07-11 DIAGNOSIS — Z8673 Personal history of transient ischemic attack (TIA), and cerebral infarction without residual deficits: Secondary | ICD-10-CM | POA: Diagnosis not present

## 2017-07-11 NOTE — Progress Notes (Signed)
Patient presents to clinic today to establish care. He is a pleasant 32 year old male who  has a past medical history of Stroke (HCC).  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 He has not taken any medications such as depakote, statin, or ASA for some time now. No cause was ever found. Denies headaches, blurred vision, decreased strength   GAD - Has taken Paxil in the past. Does not suffer from anxiety any longer.   Health Maintenance: Dental -- Routine Care  Vision -- Routine Care Immunizations -- UTD  Diet: Fish, vegetables, and fruit  Exercise: Runs multiple half marathons and marathons a years. Trains daily.    Past Medical History:  Diagnosis Date  . Stroke [facility](HCC)     Past Surgical History:  Procedure Laterality Date  . HERNIA REPAIR    . RADIOLOGY WITH ANESTHESIA N/A 02/23/2013   Procedure: RADIOLOGY WITH ANESTHESIA;  Surgeon: Oneal GroutSanjeev K Deveshwar, MD;  Location: [facility] OR;  Service: Radiology;  Laterality: N/A;  . ROTATOR CUFF REPAIR    . TEE WITHOUT CARDIOVERSION N/A 02/26/2013   Procedure: TRANSESOPHAGEAL ECHOCARDIOGRAM (TEE);  Surgeon: Vesta MixerPhilip J Nahser, MD;  [facility];  Service: Cardiovascular;  Laterality: N/A;    No current outpatient medications on file prior to visit.   No current facility-administered medications on file prior to visit.     No Known Allergies  History reviewed. No pertinent family history.  Social History   Socioeconomic History  . Marital status: Single    Spouse name: Not on file  . Number of children: Not on file  . Years of education: Not on file  . Highest education level: Not on file  Occupational History  . Not on file  Social Needs  . Financial resource strain: Not on file  . Food insecurity:    Worry: Not on file    Inability: Not on file  . Transportation  needs:    Medical: Not on file    Non-medical: Not on file  Tobacco Use  . Smoking status: Never Smoker  . Smokeless tobacco: Never Used  Substance and Sexual Activity  . Alcohol use: Yes  . Drug use: Yes    Types: Cocaine, Marijuana  . Sexual activity: Not on file  Lifestyle  . Physical activity:    Days per week: Not on file    Minutes per session: Not on file  . Stress: Not on file  Relationships  . Social connections:    Talks on phone: Not on file    Gets together: Not on file    Attends religious service: Not on file    Active member of club or organization: Not on file    Attends meetings of clubs or organizations: Not on file    Relationship status: Not on file  . Intimate partner violence:    Fear of current or ex partner: Not on file    Emotionally abused: Not on file    Physically abused: Not on file    Forced sexual activity: Not on file  Other Topics Concern  . Not on file  Social History Narrative  . Not on file    Review of Systems  Constitutional: Negative.   HENT: Negative.   Eyes: Negative.   Respiratory: Negative.   Cardiovascular: Negative.  Gastrointestinal: Negative.   Genitourinary: Negative.   Musculoskeletal: Negative.   Skin: Negative.   Neurological: Negative.   Endo/Heme/Allergies: Negative.   Psychiatric/Behavioral: Negative.     BP 100/70 (BP Location: Left Arm)   Temp 98.5 F (36.9 C) (Oral)   Wt 200 lb (90.7 kg)   BMI 27.51 kg/m   Physical Exam  Constitutional: He is oriented to person, place, and time and well-developed, well-nourished, and in no distress. No distress.  HENT:  Head: Normocephalic and atraumatic.  Right Ear: External ear normal.  Left Ear: External ear normal.  Nose: Nose normal.  Mouth/Throat: Oropharynx is clear and moist. No oropharyngeal exudate.  Eyes: Pupils are equal, round, and reactive to light. Conjunctivae and EOM are normal. Right eye exhibits no discharge. Left eye exhibits no discharge. No  scleral icterus.  Neck: Normal range of motion. Neck supple. No JVD present. No tracheal deviation present. No thyromegaly present.  Cardiovascular: Normal rate, regular rhythm, normal heart sounds and intact distal pulses. Exam reveals no gallop and no friction rub.  No murmur heard. Pulmonary/Chest: Effort normal and breath sounds normal. No stridor. No respiratory distress. He has no wheezes. He has no rales. He exhibits no tenderness.  Abdominal: Soft. Bowel sounds are normal. He exhibits no distension and no mass. There is no tenderness. There is no rebound and no guarding.  Musculoskeletal: Normal range of motion. He exhibits no edema, tenderness or deformity.  Lymphadenopathy:    He has no cervical adenopathy.  Neurological: He is alert and oriented to person, place, and time. He has normal motor skills, normal sensation, normal strength, normal reflexes and intact cranial nerves. He displays normal reflexes. No cranial nerve deficit or sensory deficit. He exhibits normal muscle tone. He shows no pronator drift. Gait normal. Coordination normal. GCS score is 15.  Skin: Skin is warm and dry. No rash noted. He is not diaphoretic. No erythema. No pallor.  Psychiatric: Mood, memory, affect and judgment normal.  Nursing note and vitals reviewed.   Assessment/Plan: 1. Routine general medical examination at a health care facility - Benign exam. Healthy young male  - Basic metabolic panel; Future - CBC with Differential/Platelet; Future - Hemoglobin A1c; Future - Hepatic function panel; Future - TSH; Future - Lipid panel; Future  2. Acute ischemic right MCA stroke (HCC) - No deficits noted - Basic metabolic panel; Future - CBC with Differential/Platelet; Future - Hemoglobin A1c; Future - Hepatic function panel; Future - TSH; Future - Lipid panel; Future   Shirline Frees, NP

## 2017-07-11 NOTE — Patient Instructions (Signed)
It was great meeting you today!   Your exam was great. Please follow up tomorrow for your blood work   I will call you when I get the labs back

## 2017-07-12 ENCOUNTER — Encounter: Payer: Self-pay | Admitting: Adult Health

## 2017-07-12 LAB — LIPID PANEL
CHOL/HDL RATIO: 3
CHOLESTEROL: 169 mg/dL (ref 0–200)
HDL: 65.7 mg/dL (ref >39.00)
LDL CALC: 95 mg/dL (ref 0–99)
NonHDL: 103.21
TRIGLYCERIDES: 43 mg/dL (ref 0.0–149.0)
VLDL: 8.6 mg/dL (ref 0.0–40.0)

## 2017-07-12 LAB — CBC WITH DIFFERENTIAL/PLATELET
BASOS ABS: 0 10*3/uL (ref 0.0–0.1)
Basophils Relative: 0.5 % (ref 0.0–3.0)
EOS ABS: 0.1 10*3/uL (ref 0.0–0.7)
Eosinophils Relative: 2.5 % (ref 0.0–5.0)
HEMATOCRIT: 41.3 % (ref 39.0–52.0)
Hemoglobin: 14.1 g/dL (ref 13.0–17.0)
Lymphocytes Relative: 37.6 % (ref 12.0–46.0)
Lymphs Abs: 1.7 10*3/uL (ref 0.7–4.0)
MCHC: 34.2 g/dL (ref 30.0–36.0)
MCV: 92.1 fl (ref 78.0–100.0)
MONOS PCT: 6.7 % (ref 3.0–12.0)
Monocytes Absolute: 0.3 10*3/uL (ref 0.1–1.0)
Neutro Abs: 2.3 10*3/uL (ref 1.4–7.7)
Neutrophils Relative %: 52.7 % (ref 43.0–77.0)
Platelets: 203 10*3/uL (ref 150.0–400.0)
RBC: 4.48 Mil/uL (ref 4.22–5.81)
RDW: 13.1 % (ref 11.5–15.5)
WBC: 4.4 10*3/uL (ref 4.0–10.5)

## 2017-07-12 LAB — BASIC METABOLIC PANEL
BUN: 10 mg/dL (ref 6–23)
CALCIUM: 9.1 mg/dL (ref 8.4–10.5)
CHLORIDE: 102 meq/L (ref 96–112)
CO2: 31 meq/L (ref 19–32)
CREATININE: 1.06 mg/dL (ref 0.40–1.50)
GFR: 104.26 mL/min (ref >60.00)
Glucose, Bld: 98 mg/dL (ref 70–99)
Potassium: 4.4 mEq/L (ref 3.5–5.1)
Sodium: 137 mEq/L (ref 135–145)

## 2017-07-12 LAB — HEPATIC FUNCTION PANEL
ALBUMIN: 4.3 g/dL (ref 3.5–5.2)
ALT: 31 U/L (ref 0–53)
AST: 30 U/L (ref 0–37)
Alkaline Phosphatase: 35 U/L — ABNORMAL LOW (ref 39–117)
BILIRUBIN DIRECT: 0.1 mg/dL (ref 0.0–0.3)
TOTAL PROTEIN: 7 g/dL (ref 6.0–8.3)
Total Bilirubin: 0.6 mg/dL (ref 0.2–1.2)

## 2017-07-12 LAB — TSH: TSH: 2.66 u[IU]/mL (ref 0.35–4.50)

## 2017-07-12 LAB — HEMOGLOBIN A1C: Hgb A1c MFr Bld: 5.6 % (ref 4.6–6.5)

## 2017-07-12 NOTE — Addendum Note (Signed)
Addended by: Charna ElizabethLEMMONS, Saman Giddens L on: 07/12/2017 09:17 AM   Modules accepted: Orders

## 2017-08-06 ENCOUNTER — Telehealth: Payer: Self-pay | Admitting: Family Medicine

## 2017-08-06 NOTE — Telephone Encounter (Signed)
Copied from CRM [id number]. Topic: General - Other >> Aug 06, 2017 10:42 AM Oneal Grout wrote: Reason for CRM: Letter from 07/12/17, needs to be updated and  Does not need to stay chronic issues, needs to say stroke. Everything is else is fine. Please advise

## 2017-08-07 NOTE — Telephone Encounter (Signed)
Ok to send in new note

## 2017-08-07 NOTE — Telephone Encounter (Signed)
Ok to alter note?

## 2017-08-08 ENCOUNTER — Encounter: Payer: Self-pay | Admitting: Family Medicine

## 2017-08-08 NOTE — Telephone Encounter (Signed)
Marcus Hughes 08/08/2017 01:09 PM  Patient wants to know if this was faxed because [facility] in [place] did not receive it. Please re - send

## 2017-08-08 NOTE — Telephone Encounter (Signed)
Spoke to the pt and informed him the letter is ready.  He asked that I fax the number to [phone].  No further action required.

## 2017-08-08 NOTE — Telephone Encounter (Signed)
Letter printed and waiting on signature

## 2017-08-09 NOTE — Telephone Encounter (Signed)
New number to fax is [phone].

## 2017-08-10 NOTE — Telephone Encounter (Signed)
Faxed to new #.  Received confirmation the fax was successful.  No further action required.

## 2017-12-26 ENCOUNTER — Telehealth: Payer: Self-pay | Admitting: Adult Health

## 2017-12-26 NOTE — Telephone Encounter (Signed)
Copied from CRM #[id number]. Topic: General - Other >>[phone] Dec 26, 2017  4:55 PM Debroah LoopLander, Lumin L wrote: Reason for CRM: [facility] to get treatment notes and lab results from 07/11/2017 regarding his fit for deployment letter that [employer] provided. Fax: [phone]

## 2017-12-27 NOTE — Telephone Encounter (Signed)
Called patient and verified that the records from this date [facility]. Ok to fax?

## 2017-12-27 NOTE — Telephone Encounter (Signed)
Ok to fax-

## 2017-12-27 NOTE — Telephone Encounter (Signed)
Office visit notes and labs have been faxed

## 2017-12-31 NOTE — Telephone Encounter (Signed)
Records have been re-sent to requested fax number.

## 2017-12-31 NOTE — Telephone Encounter (Signed)
Desiree called requesting that records be resent - they are having trouble with the fax that this was sent to - please resend to fax # [phone]

## 2018-01-01 NOTE — Telephone Encounter (Signed)
Desiree calling back and is wondering can the records be re fax. The fax machine was not working .Please re send FAX # [phone]

## 2018-01-02 ENCOUNTER — Telehealth: Payer: Self-pay | Admitting: Adult Health

## 2018-01-02 NOTE — Telephone Encounter (Signed)
Records have been re-faxed.

## 2018-01-02 NOTE — Telephone Encounter (Signed)
Left voicemail for Marcus Hughes.  Did not leave detailed message - called because we need a medical release signed by the patient before we send them treatment notes and lab results.

## 2018-01-02 NOTE — Telephone Encounter (Signed)
Copied from CRM [id number]. Topic: General - Other >> Dec 26, 2017  4:55 PM Debroah Loop wrote: Reason for CRM: Desiree with [facility] calling to get treatment notes and lab results from 07/11/2017 regarding his fit for deployment letter that [employer] provided. Fax: [phone] >> Jan 02, 2018 12:14 PM Trula Slade wrote: Cristie Hem with [facility] [phone] is checking on the status of her request for the patient's treatment notes and lab results from 07/11/2017 regarding his fit for deployment letter that [employer] provided to be faxed to: [phone].  An alternate fax number is [phone].

## 2018-04-29 ENCOUNTER — Ambulatory Visit (HOSPITAL_COMMUNITY)
Admission: EM | Admit: 2018-04-29 | Discharge: 2018-04-29 | Disposition: A | Discharge status: Home / Self Care | Attending: Family Medicine | Admitting: Family Medicine

## 2018-04-29 ENCOUNTER — Encounter (HOSPITAL_COMMUNITY): Payer: Self-pay

## 2018-04-29 ENCOUNTER — Other Ambulatory Visit: Payer: Self-pay

## 2018-04-29 DIAGNOSIS — Z202 Contact with and (suspected) exposure to infections with a predominantly sexual mode of transmission: Secondary | ICD-10-CM | POA: Insufficient documentation

## 2018-04-29 DIAGNOSIS — Z113 Encounter for screening for infections with a predominantly sexual mode of transmission: Secondary | ICD-10-CM

## 2018-04-29 MED ORDER — LIDOCAINE HCL (PF) 1 % IJ SOLN
INTRAMUSCULAR | Status: AC
  Filled 2018-04-29: qty 2

## 2018-04-29 MED ORDER — CEFTRIAXONE SODIUM 250 MG IJ SOLR
250.0000 mg | Freq: Once | INTRAMUSCULAR | Status: AC
  Administered 2018-04-29: 250 mg via INTRAMUSCULAR

## 2018-04-29 MED ORDER — AZITHROMYCIN 250 MG PO TABS
1000.0000 mg | ORAL_TABLET | Freq: Once | ORAL | Status: AC
  Administered 2018-04-29: 1000 mg via ORAL

## 2018-04-29 MED ORDER — CEFTRIAXONE SODIUM 250 MG IJ SOLR
INTRAMUSCULAR | Status: AC
  Filled 2018-04-29: qty 250

## 2018-04-29 MED ORDER — AZITHROMYCIN 250 MG PO TABS
ORAL_TABLET | ORAL | Status: AC
  Filled 2018-04-29: qty 4

## 2018-04-29 NOTE — Discharge Instructions (Signed)
Your urine test is pending We will go ahead and treat you today prophylactically for gonorrhea and chlamydia Please refrain from sexual activity for at least 7 days Follow up as needed for continued or worsening symptoms

## 2018-04-29 NOTE — ED Triage Notes (Signed)
Pt states she was told by his partner that she has a STD. Pt states he would like to be tested .

## 2018-04-29 NOTE — ED Provider Notes (Signed)
[facility]    CSN: [id number] Arrival date & time: 04/29/18  5003     History   Chief Complaint Chief Complaint  Patient presents with  . SEXUALLY TRANSMITTED DISEASE    HPI Marcus Hughes is a 33 y.o. male.   Patient is a 33 year old male with past medical history of stroke that presents for STD screening.  He reports that he has been exposed to chlamydia.  He has had some mild dysuria.  He denies any fevers, chills, abdominal pain, penile discharge.  He denies any testicular swelling or penile lesions.  He has been having unprotected sex.  ROS per HPI      Past Medical History:  Diagnosis Date  . Stroke [facility])     Patient Active Problem List   Diagnosis Date Noted  . Embolic stroke involving middle cerebral artery (HCC) 03/02/2013  . Acute ischemic right MCA stroke (HCC) 02/26/2013  . Other and unspecified hyperlipidemia 02/26/2013  . Acute embolic stroke (HCC) 02/24/2013    Past Surgical History:  Procedure Laterality Date  . HERNIA REPAIR    . RADIOLOGY WITH ANESTHESIA N/A 02/23/2013   Procedure: RADIOLOGY WITH ANESTHESIA;  Surgeon: Oneal Grout, MD;  Location: [facility] OR;  Service: Radiology;  Laterality: N/A;  . ROTATOR CUFF REPAIR    . TEE WITHOUT CARDIOVERSION N/A 02/26/2013   Procedure: TRANSESOPHAGEAL ECHOCARDIOGRAM (TEE);  Surgeon: Vesta Mixer, MD;  Location: [facility] ENDOSCOPY;  Service: Cardiovascular;  Laterality: N/A;       Home Medications    Prior to Admission medications   Not on File    Family History History reviewed. No pertinent family history.  Social History Social History   Tobacco Use  . Smoking status: Never Smoker  . Smokeless tobacco: Never Used  Substance Use Topics  . Alcohol use: Yes  . Drug use: Yes    Types: Cocaine, Marijuana     Allergies   Patient has no known allergies.   Review of Systems Review of Systems  Gastrointestinal: Negative for abdominal pain and nausea.  Genitourinary:  Positive for dysuria. Negative for decreased urine volume, difficulty urinating, discharge, frequency, genital sores, hematuria, penile pain, penile swelling, scrotal swelling, testicular pain and urgency.  Musculoskeletal: Negative for back pain.     Physical Exam Triage Vital Signs ED Triage Vitals  Enc Vitals Group     BP 04/29/18 0821 136/78     Pulse Rate 04/29/18 0821 78     Resp 04/29/18 0821 16     Temp 04/29/18 0821 98.4 F (36.9 C)     Temp Source 04/29/18 0821 Oral     SpO2 04/29/18 0821 100 %     Weight 04/29/18 0837 185 lb (83.9 kg)     Height --      Head Circumference --      Peak Flow --      Pain Score --      Pain Loc --      Pain Edu? --      Excl. in GC? --    No data found.  Updated Vital Signs BP 136/78 (BP Location: Left Arm)   Pulse 78   Temp 98.4 F (36.9 C) (Oral)   Resp 16   Wt 185 lb (83.9 kg)   SpO2 100%   BMI 25.44 kg/m   Visual Acuity Right Eye Distance:   Left Eye Distance:   Bilateral Distance:    Right Eye Near:   Left Eye Near:  Bilateral Near:     Physical Exam Vitals signs and nursing note reviewed.  Constitutional:      Appearance: He is well-developed.  HENT:     Head: Normocephalic and atraumatic.  Eyes:     Conjunctiva/sclera: Conjunctivae normal.  Neck:     Musculoskeletal: Neck supple.  Cardiovascular:     Rate and Rhythm: Normal rate and regular rhythm.     Heart sounds: No murmur.  Pulmonary:     Effort: Pulmonary effort is normal. No respiratory distress.     Breath sounds: Normal breath sounds.  Abdominal:     Palpations: Abdomen is soft.     Tenderness: There is no abdominal tenderness.  Genitourinary:    Comments: Deferred Skin:    General: Skin is warm and dry.  Neurological:     Mental Status: He is alert.      UC Treatments / Results  Labs (all labs ordered are listed, but only abnormal results are displayed) Labs Reviewed  URINE CYTOLOGY ANCILLARY ONLY     EKG None  Radiology No results found.  Procedures Procedures (including critical care time)  Medications Ordered in UC Medications  cefTRIAXone (ROCEPHIN) injection 250 mg (has no administration in time range)  azithromycin (ZITHROMAX) tablet 1,000 mg (has no administration in time range)    Initial Impression / Assessment and Plan / UC Course  I have reviewed the triage vital signs and the nursing notes.  Pertinent labs & imaging results that were available during my care of the patient were reviewed by me and considered in my medical decision making (see chart for details).     We will go ahead and send urine for cytology testing Lab results pending Treating prophylactically for gonorrhea and chlamydia Follow up as needed for continued or worsening symptoms  Final Clinical Impressions(s) / UC Diagnoses   Final diagnoses:  STD exposure     Discharge Instructions     Your urine test is pending We will go ahead and treat you today prophylactically for gonorrhea and chlamydia Please refrain from sexual activity for at least 7 days Follow up as needed for continued or worsening symptoms     ED Prescriptions    None     Controlled Substance Prescriptions [place] Controlled Substance Registry consulted? Not Applicable   Janace Aris, NP 04/29/18 [phone]

## 2018-04-30 LAB — URINE CYTOLOGY ANCILLARY ONLY
CHLAMYDIA, DNA PROBE: NEGATIVE
Neisseria Gonorrhea: NEGATIVE
Trichomonas: NEGATIVE

## 2019-06-29 ENCOUNTER — Encounter (HOSPITAL_COMMUNITY): Payer: Self-pay

## 2019-06-29 ENCOUNTER — Emergency Department (HOSPITAL_COMMUNITY)
Admission: EM | Admit: 2019-06-29 | Discharge: 2019-06-30 | Disposition: A | Discharge status: Home / Self Care | Attending: Emergency Medicine | Admitting: Emergency Medicine

## 2019-06-29 ENCOUNTER — Emergency Department (HOSPITAL_COMMUNITY)

## 2019-06-29 ENCOUNTER — Other Ambulatory Visit: Payer: Self-pay

## 2019-06-29 DIAGNOSIS — L03116 Cellulitis of left lower limb: Secondary | ICD-10-CM | POA: Diagnosis not present

## 2019-06-29 DIAGNOSIS — M25572 Pain in left ankle and joints of left foot: Secondary | ICD-10-CM | POA: Diagnosis present

## 2019-06-29 MED ORDER — DOXYCYCLINE HYCLATE 100 MG PO TABS
100.0000 mg | ORAL_TABLET | Freq: Once | ORAL | Status: AC
  Administered 2019-06-30: 100 mg via ORAL
  Filled 2019-06-29: qty 1

## 2019-06-29 MED ORDER — DOXYCYCLINE HYCLATE 100 MG PO CAPS: 100.0000 mg | ORAL_CAPSULE | Freq: Two times a day (BID) | ORAL | 0 refills | Status: DC

## 2019-06-29 NOTE — ED Provider Notes (Signed)
[facility]-EMERGENCY DEPT Provider Note   CSN: [id number] Arrival date & time: 06/29/19  2126     History Chief Complaint  Patient presents with  . Leg Pain    Marcus Hughes is a 34 y.o. male.  Patient presents to the emergency department with a chief complaint of left ankle pain.  He states that he noticed swelling and pain this morning.  He states the pain is mostly located on the outside of his ankle.  He denies any known injuries.  Denies any fever or chills.  He states that he is a runner, and has been running a lot recently.  Denies any known insect bites.  Denies any treatments prior to arrival.  The history is provided by the patient. No language interpreter was used.       Past Medical History:  Diagnosis Date  . Stroke [facility])     Patient Active Problem List   Diagnosis Date Noted  . Embolic stroke involving middle cerebral artery (HCC) 03/02/2013  . Acute ischemic right MCA stroke (HCC) 02/26/2013  . Other and unspecified hyperlipidemia 02/26/2013  . Acute embolic stroke (HCC) 02/24/2013    Past Surgical History:  Procedure Laterality Date  . HERNIA REPAIR    . RADIOLOGY WITH ANESTHESIA N/A 02/23/2013   Procedure: RADIOLOGY WITH ANESTHESIA;  Surgeon: Oneal Grout, MD;  Location: [facility] OR;  Service: Radiology;  Laterality: N/A;  . ROTATOR CUFF REPAIR    . TEE WITHOUT CARDIOVERSION N/A 02/26/2013   Procedure: TRANSESOPHAGEAL ECHOCARDIOGRAM (TEE);  Surgeon: Vesta Mixer, MD;  Location: [facility] ENDOSCOPY;  Service: Cardiovascular;  Laterality: N/A;       History reviewed. No pertinent family history.  Social History   Tobacco Use  . Smoking status: Never Smoker  . Smokeless tobacco: Never Used  Substance Use Topics  . Alcohol use: Yes  . Drug use: Yes    Types: Cocaine, Marijuana    Home Medications Prior to Admission medications   Not on File    Allergies    Patient has no known allergies.  Review of Systems     Review of Systems  Constitutional: Negative for chills and fever.  Musculoskeletal: Positive for gait problem, joint swelling and myalgias. Negative for arthralgias.  Skin: Positive for color change. Negative for rash and wound.  Neurological: Negative for weakness and numbness.    Physical Exam Updated Vital Signs BP (!) 146/103   Pulse 77   Temp 98.5 F (36.9 C)   Resp 18   SpO2 98%   Physical Exam Vitals and nursing note reviewed.  Constitutional:      General: He is not in acute distress.    Appearance: He is well-developed. He is not ill-appearing.  HENT:     Head: Normocephalic and atraumatic.  Eyes:     Conjunctiva/sclera: Conjunctivae normal.  Cardiovascular:     Rate and Rhythm: Normal rate.     Pulses: Normal pulses.     Comments: Intact distal pulses, brisk capillary refill Pulmonary:     Effort: Pulmonary effort is normal. No respiratory distress.  Abdominal:     General: There is no distension.  Musculoskeletal:     Cervical back: Neck supple.       Feet:     Comments: Moderate swelling of the left ankle and foot, range of motion is limited near the endpoint of dorsiflexion secondary to pain, but otherwise plantarflexion is normal, inversion and eversion are normal, no bony abnormality or deformity  Skin:    General: Skin is warm and dry.     Comments: Erythema surrounding left lateral ankle just proximal to the lateral malleolus, appears consistent with cellulitis, it is not fluctuant, doubt abscess  Neurological:     Mental Status: He is alert and oriented to person, place, and time.  Psychiatric:        Mood and Affect: Mood normal.        Behavior: Behavior normal.     ED Results / Procedures / Treatments   Labs (all labs ordered are listed, but only abnormal results are displayed) Labs Reviewed - No data to display  EKG None  Radiology DG Ankle Complete Left  Result Date: 06/29/2019 CLINICAL DATA:  Pain, swelling EXAM: LEFT ANKLE  COMPLETE - 3+ VIEW COMPARISON:  None. FINDINGS: Lateral soft tissue swelling. No acute bony abnormality. Specifically, no fracture, subluxation, or dislocation. Joint spaces maintained. IMPRESSION: No acute bony abnormality. Electronically Signed   By: Rolm Baptise M.D.   On: 06/29/2019 22:43    Procedures Ultrasound ED Soft Tissue  Date/Time: 06/29/2019 11:19 PM Performed by: Montine Circle, PA-C Authorized by: Montine Circle, PA-C   Procedure details:    Indications: evaluate for cellulitis     Transverse view:  Visualized   Longitudinal view:  Visualized   Images: archived   Location:    Location: lower extremity     Side:  Left Findings:     no abscess present    cellulitis present    no foreign body present   (including critical care time)  Medications Ordered in ED Medications - No data to display  ED Course  I have reviewed the triage vital signs and the nursing notes.  Pertinent labs & imaging results that were available during my care of the patient were reviewed by me and considered in my medical decision making (see chart for details).    MDM Rules/Calculators/A&P                      Patient with localized swelling around the left ankle laterally.  He has some mild tenderness to palpation.  There is associated erythema proximal to the lateral malleolus.  His physical exam appears consistent with cellulitis.  I see no evidence of abscess.  Will check plain films and bedside ultrasound.  I have a low suspicion for septic arthritis given his almost normal ROM and ability to walk and bear weight normally.  There is no calf tenderness, doubt DVT.  His compartments are soft, doubt compartment syndrome.  X-ray negative.  US shows cobblestoning consistent with cellulitis.  Will treat with doxy.  Will give crutches and ace wrap for compression and immobilization.  Final Clinical Impression(s) / ED Diagnoses Final diagnoses:  Cellulitis of left lower extremity     Rx / DC Orders ED Discharge Orders    None       Tasheem, [place], PA-C 06/29/19 2322    Sherwood Gambler, MD 07/01/19 0040

## 2019-06-29 NOTE — ED Triage Notes (Signed)
Pt presents with left lower leg/ankle pain and swelling. Pt states that he is a runner and a few weeks ago he began having pain and swelling. Pt states yesterday the swelling and pain returned. Pt states he wanted to be seen because he has a hx of CVA and was afraid of a DVT. Pt is ambulatory, A&Ox4.

## 2019-07-07 ENCOUNTER — Telehealth: Payer: Self-pay | Admitting: Adult Health

## 2019-07-07 NOTE — Telephone Encounter (Signed)
Patient called to make a follow up appointment with Kandee Keen from a hospital visit for Cellulitis of left lower extremity.  I have made him an appointment for 07/14/2019 at 3 PM with [facility].  The patient had questions about being able to run while he's still taking antibiotics or should he be resting and staying off his leg.   He also works full time in [employer] should he wear his boot while he's at work.  Please advise

## 2019-07-08 NOTE — Telephone Encounter (Signed)
He can exercise as normal. He can wear his boots but wear long socks when wearing his boots

## 2019-07-08 NOTE — Telephone Encounter (Signed)
Left a message for pt to return my call. 

## 2019-07-10 NOTE — Telephone Encounter (Signed)
Spoke to the pt and informed him of the message from [place].  Pt is coming in next week. States he is healing well.  Completed the antibiotics.  Nothing further needed.

## 2019-07-10 NOTE — Telephone Encounter (Signed)
Patient missed a call from our office. I advised the patient that Lanice Schwab will return his call.   I didn't see any notes on the reason for the call.  Please advise

## 2019-07-10 NOTE — Telephone Encounter (Signed)
Spoke to the pt and informed him that I have not called again.  Must have been an old message.  Nothing further needed.

## 2019-07-15 ENCOUNTER — Telehealth (INDEPENDENT_AMBULATORY_CARE_PROVIDER_SITE_OTHER): Admitting: Adult Health

## 2019-07-15 ENCOUNTER — Encounter: Payer: Self-pay | Admitting: Adult Health

## 2019-07-15 VITALS — Temp 98.5°F | Wt 185.0 lb

## 2019-07-15 DIAGNOSIS — Z20822 Contact with and (suspected) exposure to covid-19: Secondary | ICD-10-CM | POA: Diagnosis not present

## 2019-07-15 DIAGNOSIS — L03116 Cellulitis of left lower limb: Secondary | ICD-10-CM | POA: Diagnosis not present

## 2019-07-15 NOTE — Progress Notes (Signed)
Virtual Visit via Video Note  I connected with Marcus Hughes on 07/15/19 at  3:00 PM EDT by a video enabled telemedicine application and verified that I am speaking with the correct person using two identifiers.  Location patient: home Location provider:work or home office Persons participating in the virtual visit: patient, provider  I discussed the limitations of evaluation and management by telemedicine and the availability of in person appointments. The patient expressed understanding and agreed to proceed.   HPI: He was seen in the emergency room on 06/29/2019 and diagnosed with cellulitis of the left lower extremity. He was placed on a 10-day course of doxycycline. Today on follow-up he reports that his cellulitis has completely resolved, he no longer has redness, warmth, or swelling to his left lower extremity.  His biggest concern today is that of an acute issue that started approximately 3 days ago. His symptoms include nasal congestion, fevers up to 100, chills, sore throat, generalized fatigue. Today he feels better than yesterday but still continues to feel acutely ill. He denies loss of taste or smell, chest pain, shortness of breath, nausea, vomiting, or diarrhea.   ROS: See pertinent positives and negatives per HPI.  Past Medical History:  Diagnosis Date  . Stroke [facility])     Past Surgical History:  Procedure Laterality Date  . HERNIA REPAIR    . RADIOLOGY WITH ANESTHESIA N/A 02/23/2013   Procedure: RADIOLOGY WITH ANESTHESIA;  Surgeon: Oneal Grout, MD;  Location: [facility] OR;  Service: Radiology;  Laterality: N/A;  . ROTATOR CUFF REPAIR    . TEE WITHOUT CARDIOVERSION N/A 02/26/2013   Procedure: TRANSESOPHAGEAL ECHOCARDIOGRAM (TEE);  Surgeon: Vesta Mixer, MD;  Location: [facility] ENDOSCOPY;  Service: Cardiovascular;  Laterality: N/A;    History reviewed. No pertinent family history.    No current outpatient medications on file.  EXAM:  VITALS per patient if  applicable:  GENERAL: alert, oriented, appears well and in no acute distress  HEENT: atraumatic, conjunttiva clear, no obvious abnormalities on inspection of external nose and ears  NECK: normal movements of the head and neck  LUNGS: on inspection no signs of respiratory distress, breathing rate appears normal, no obvious gross SOB, gasping or wheezing  CV: no obvious cyanosis  MS: moves all visible extremities without noticeable abnormality  PSYCH/NEURO: pleasant and cooperative, no obvious depression or anxiety, speech and thought processing grossly intact  ASSESSMENT AND PLAN:  Discussed the following assessment and plan: 1. Cellulitis of left lower extremity -Has resolved completely. Follow-up as needed  2. Suspected COVID-19 virus infection Recently just finished his antibiotic therapy, doubt bacterial infection. Doubt seasonal allergies as he has fever and chills. Need to rule out COVID-19 as he was in the emergency room approximately 2 weeks ago. He will schedule a test for Covid testing at [facility]. Advised to stay hydrated and rest. He can take TheraFlu or other over-the-counter cold medication for symptom management. He should stay quarantined until his test results   I discussed the assessment and treatment plan with the patient. The patient was provided an opportunity to ask questions and all were answered. The patient agreed with the plan and demonstrated an understanding of the instructions.   The patient was advised to call back or seek an in-person evaluation if the symptoms worsen or if the condition fails to improve as anticipated.   Shirline Frees, NP

## 2019-07-16 ENCOUNTER — Ambulatory Visit: Attending: Internal Medicine

## 2019-07-16 DIAGNOSIS — Z20822 Contact with and (suspected) exposure to covid-19: Secondary | ICD-10-CM

## 2019-07-17 LAB — NOVEL CORONAVIRUS, NAA: SARS-CoV-2, NAA: NOT DETECTED

## 2019-07-17 LAB — SARS-COV-2, NAA 2 DAY TAT

## 2019-07-18 ENCOUNTER — Telehealth: Payer: Self-pay | Admitting: Adult Health

## 2019-07-18 NOTE — Telephone Encounter (Signed)
Spoke to the pt.  He said he continues to have chills, fever and a sore throat.  He also feels "off."  Advised him to get plenty of rest over the weekend and drink fluids.  OTC pain medication for the pain in his throat.  He will call to schedule an appointment on Monday if symptoms persist. Nothing further needed.

## 2019-07-18 NOTE — Telephone Encounter (Signed)
The patient found out today that his COVID test was negative and wants to know what his next steps are.  Please advise

## 2019-07-18 NOTE — Telephone Encounter (Signed)
That depends if he still having symptoms.  We know is that he does not have COVID-19.  If he still has symptoms he is not out of the window of having another viral illness.  If he is not feeling any better by early next week then I would want to see him again.

## 2019-07-24 NOTE — Telephone Encounter (Signed)
R. Lymph node has been swollen and hurting since he started feeling bad. He is not having anymore chills, fever, sore throat or sweats. Those symptoms has subsided but his R. lymph node is still bothering him. He is wondering what he should do? He has been taking cough syrup, Theraflu, and NyQuil but nothing has helped.   Pt can be reached at [phone] -ok to leave a detailed message per pt

## 2019-07-25 NOTE — Telephone Encounter (Signed)
He is having no other symptoms besides a swollen lymph node that he does not need to take any medicatio  The swollen lymph node is a immune response to the sickness he was going through.  It is not uncommon for these to be swollen and tender 2 to 4 weeks after the infection has cleared up.

## 2019-07-25 NOTE — Telephone Encounter (Signed)
Spoke to [place] and informed him of the below message.  Nothing further needed.
